# Patient Record
Sex: Female | Born: 1990 | Race: Black or African American | Hispanic: No | Marital: Single | Smoking: Never smoker
Health system: Southern US, Community
[De-identification: ages and names within clinical notes are randomized; demographics above are authoritative.]

## PROBLEM LIST (undated history)

## (undated) DIAGNOSIS — Z973 Presence of spectacles and contact lenses: Secondary | ICD-10-CM

## (undated) DIAGNOSIS — F419 Anxiety disorder, unspecified: Secondary | ICD-10-CM

## (undated) HISTORY — DX: Anxiety disorder, unspecified: F41.9

## (undated) HISTORY — DX: Presence of spectacles and contact lenses: Z97.3

---
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
## 2010-12-12 ENCOUNTER — Encounter: Payer: Self-pay | Admitting: Family Medicine

## 2010-12-22 ENCOUNTER — Ambulatory Visit: Admission: RE | Admit: 2010-12-22 | Discharge: 2010-12-22 | Payer: Self-pay | Source: Home / Self Care

## 2010-12-22 DIAGNOSIS — R609 Edema, unspecified: Secondary | ICD-10-CM | POA: Insufficient documentation

## 2010-12-28 NOTE — Miscellaneous (Signed)
  Clinical Lists Changes  Observations: Added new observation of SOCIAL HX: Dad: [place] Mom: Alan Ripper Sisters: Lennox Grumbles, Fayrene Helper Brothers: Widdi, Otteme  [employer] (12/12/2010 17:00) Added new observation of PSH REVIEWED: reviewed - no changes required (12/12/2010 17:00) Added new observation of PAST SURG HX: None (12/12/2010 17:00) Added new observation of PMH REVIEWED: reviewed - no changes required (12/12/2010 17:00) Added new observation of PAST MED HX: None (12/12/2010 17:00)      Past History:  Past Medical History: None  Past Surgical History: None   Social History: Dad: Nguessen Mom: [place] Sisters: Lennox Grumbles, Fayrene Helper Brothers: Widdi, Otteme  [employer]

## 2010-12-28 NOTE — Assessment & Plan Note (Signed)
Summary: np/family sees Milah Recht/eo   Vital Signs:  Patient profile:   20 year old female Height:      64 inches Weight:      1353.44 pounds BMI:     233.16 BSA:     4.41 Temp:     97.9 degrees F Pulse rate:   72 / minute BP sitting:   109 / 78  Vitals Entered By: Jone Baseman CMA (December 22, 2010 4:27 PM) CC: new patient Is Patient Diabetic? No Pain Assessment Patient in pain? no        CC:  new patient.  History of Present Illness: New patient visit  Questions or concerns 1. Leg swelling:  She usually gets leg swelling if she is on her feet for a couple of hours.    Health Maintanence: 1. Diet: Eats a fairly balanced diet.  Lots of carbs.  Lots of salt 2. Exercise: Doesn't exercise 3. Sexual Health: Isn't sexually active now 4. Wears seatbelts    Habits & Providers  Alcohol-Tobacco-Diet     Tobacco Status: never  Current Medications (verified): 1)  None  Past History:  Past Medical History: Reviewed history from 12/12/2010 and no changes required. None  Social History: Dad: Nguessen Mom: Alan Ripper Sisters: Emma, [place], [place] Brothers: Widdi, Otteme  [address] - Liberal Arts degree - interested in going to law school to do [employer] Status:  never  Review of Systems       The patient complains of peripheral edema.  The patient denies fever, weight loss, decreased hearing, hoarseness, syncope, prolonged cough, headaches, and abdominal pain.    Physical Exam  General:  Vitals reviewed.  Well appearing.  Comfortable.  Normal dress. Head:  normocephalic and atraumatic.   Eyes:  vision grossly intact.  PERRL, EOMI Ears:  R ear normal and L ear normal.   Nose:  no external deformity.   Mouth:  good dentition and pharynx pink and moist.   Neck:  supple, full ROM, and no masses.   Lungs:  normal respiratory effort, normal breath sounds, no crackles, and no wheezes.   Heart:  normal rate, regular rhythm, and no  murmur.   Abdomen:  soft, non-tender, normal bowel sounds, no distention, and no masses.   Msk:  normal ROM.   Extremities:  no LE edema Neurologic:  alert & oriented X3, strength normal in all extremities, sensation intact to light touch, and gait normal.   Skin:  color normal and no rashes.   Psych:  not anxious appearing and not depressed appearing.     Impression & Recommendations:  Problem # 1:  Preventive Health Care (ICD-V70.0) Assessment New Healthy 20 yo F.  Reviewed normal health maintanence issues for college age females.  Problem # 2:  LEG EDEMA, BILATERAL (ICD-782.3) Assessment: New None today on exam.  Sounds like dependent edema after standing for a long time.  Advised her to cut back on salt.  No other red flags.  Patient Instructions: 1)  You are doing well  2)  Please schedule a follow up appointment in 1 year, sooner if you have questions or concerns   Orders Added: 1)  [facility]- New Level 3 [99203]

## 2011-03-12 LAB — RAPID STREP SCREEN (MED CTR MEBANE ONLY): Streptococcus, Group A Screen (Direct): NEGATIVE

## 2011-07-02 ENCOUNTER — Emergency Department (HOSPITAL_COMMUNITY): Payer: Self-pay

## 2011-07-02 ENCOUNTER — Emergency Department (HOSPITAL_COMMUNITY)
Admission: EM | Admit: 2011-07-02 | Discharge: 2011-07-02 | Disposition: A | Discharge status: Home / Self Care | Payer: Self-pay | Attending: Emergency Medicine | Admitting: Emergency Medicine

## 2011-07-02 DIAGNOSIS — M79609 Pain in unspecified limb: Secondary | ICD-10-CM | POA: Insufficient documentation

## 2011-07-02 DIAGNOSIS — M25469 Effusion, unspecified knee: Secondary | ICD-10-CM | POA: Insufficient documentation

## 2011-07-02 DIAGNOSIS — M25569 Pain in unspecified knee: Secondary | ICD-10-CM | POA: Insufficient documentation

## 2011-07-02 DIAGNOSIS — M7989 Other specified soft tissue disorders: Secondary | ICD-10-CM | POA: Insufficient documentation

## 2012-05-28 ENCOUNTER — Encounter (HOSPITAL_COMMUNITY): Payer: Self-pay | Admitting: *Deleted

## 2012-05-28 ENCOUNTER — Emergency Department (HOSPITAL_COMMUNITY)
Admission: EM | Admit: 2012-05-28 | Discharge: 2012-05-28 | Disposition: A | Discharge status: Home / Self Care | Payer: No Typology Code available for payment source | Attending: Emergency Medicine | Admitting: Emergency Medicine

## 2012-05-28 DIAGNOSIS — S060X9A Concussion with loss of consciousness of unspecified duration, initial encounter: Secondary | ICD-10-CM | POA: Insufficient documentation

## 2012-05-28 MED ORDER — DIAZEPAM 5 MG PO TABS: 5.0000 mg | ORAL_TABLET | Freq: Two times a day (BID) | ORAL | Status: AC

## 2012-05-28 NOTE — ED Provider Notes (Signed)
Medical screening examination/treatment/procedure(s) were performed by non-physician practitioner and as supervising physician I was immediately available for consultation/collaboration.  Kasey Ewings T Culley Hedeen, MD 05/28/12 2040 
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
## 2013-03-02 ENCOUNTER — Encounter (HOSPITAL_COMMUNITY): Payer: Self-pay | Admitting: Emergency Medicine

## 2013-03-02 ENCOUNTER — Emergency Department (HOSPITAL_COMMUNITY): Payer: Self-pay

## 2013-03-02 ENCOUNTER — Emergency Department (HOSPITAL_COMMUNITY)
Admission: EM | Admit: 2013-03-02 | Discharge: 2013-03-02 | Disposition: A | Discharge status: Home / Self Care | Payer: Self-pay | Attending: Emergency Medicine | Admitting: Emergency Medicine

## 2013-03-02 DIAGNOSIS — R0602 Shortness of breath: Secondary | ICD-10-CM | POA: Insufficient documentation

## 2013-03-02 DIAGNOSIS — R0789 Other chest pain: Secondary | ICD-10-CM | POA: Insufficient documentation

## 2013-03-02 MED ORDER — PREDNISONE 20 MG PO TABS: 40.0000 mg | ORAL_TABLET | Freq: Every day | ORAL | Status: DC

## 2013-03-02 MED ORDER — ALBUTEROL SULFATE HFA 108 (90 BASE) MCG/ACT IN AERS: 1.0000 | INHALATION_SPRAY | Freq: Four times a day (QID) | RESPIRATORY_TRACT | Status: DC | PRN
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
## 2013-03-04 NOTE — ED Provider Notes (Signed)
Medical screening examination/treatment/procedure(s) were performed by non-physician practitioner and as supervising physician I was immediately available for consultation/collaboration.  Glynn Octave, MD 03/04/13 1001

## 2014-04-10 ENCOUNTER — Emergency Department (HOSPITAL_COMMUNITY): Payer: No Typology Code available for payment source

## 2014-04-10 ENCOUNTER — Emergency Department (HOSPITAL_COMMUNITY)
Admission: EM | Admit: 2014-04-10 | Discharge: 2014-04-11 | Disposition: A | Discharge status: Home / Self Care | Payer: No Typology Code available for payment source | Attending: Emergency Medicine | Admitting: Emergency Medicine

## 2014-04-10 DIAGNOSIS — J189 Pneumonia, unspecified organism: Secondary | ICD-10-CM

## 2014-04-10 DIAGNOSIS — R000 Tachycardia, unspecified: Secondary | ICD-10-CM | POA: Insufficient documentation

## 2014-04-10 DIAGNOSIS — J159 Unspecified bacterial pneumonia: Secondary | ICD-10-CM | POA: Insufficient documentation

## 2014-04-10 DIAGNOSIS — J029 Acute pharyngitis, unspecified: Secondary | ICD-10-CM | POA: Insufficient documentation

## 2014-04-10 DIAGNOSIS — H53149 Visual discomfort, unspecified: Secondary | ICD-10-CM | POA: Insufficient documentation

## 2014-04-10 LAB — RAPID STREP SCREEN (MED CTR MEBANE ONLY): Streptococcus, Group A Screen (Direct): NEGATIVE

## 2014-04-10 MED ORDER — DIPHENHYDRAMINE HCL 50 MG/ML IJ SOLN
25.0000 mg | Freq: Once | INTRAMUSCULAR | Status: AC
  Administered 2014-04-10: 25 mg via INTRAVENOUS
  Filled 2014-04-10: qty 1

## 2014-04-10 MED ORDER — ACETAMINOPHEN 500 MG PO TABS
1000.0000 mg | ORAL_TABLET | Freq: Once | ORAL | Status: AC
  Administered 2014-04-10: 1000 mg via ORAL
  Filled 2014-04-10: qty 2

## 2014-04-10 MED ORDER — KETOROLAC TROMETHAMINE 30 MG/ML IJ SOLN
30.0000 mg | Freq: Once | INTRAMUSCULAR | Status: AC
  Administered 2014-04-10: 30 mg via INTRAVENOUS
  Filled 2014-04-10: qty 1

## 2014-04-10 MED ORDER — METOCLOPRAMIDE HCL 5 MG/ML IJ SOLN
10.0000 mg | Freq: Once | INTRAMUSCULAR | Status: AC
  Administered 2014-04-10: 10 mg via INTRAVENOUS
  Filled 2014-04-10: qty 2

## 2014-04-10 MED ORDER — AZITHROMYCIN 250 MG PO TABS: 250.0000 mg | ORAL_TABLET | Freq: Every day | ORAL | Status: AC

## 2014-04-10 MED ORDER — SODIUM CHLORIDE 0.9 % IV BOLUS (SEPSIS)
1000.0000 mL | Freq: Once | INTRAVENOUS | Status: AC
  Administered 2014-04-10: 1000 mL via INTRAVENOUS

## 2014-04-10 MED ORDER — AZITHROMYCIN 250 MG PO TABS
500.0000 mg | ORAL_TABLET | Freq: Once | ORAL | Status: AC
  Administered 2014-04-10: 500 mg via ORAL
  Filled 2014-04-10: qty 2

## 2014-04-10 NOTE — ED Provider Notes (Signed)
CSN: [id number]     Arrival date & time 04/10/14  1939 History   First MD Initiated Contact with Patient 04/10/14 2027     No chief complaint on file.    (Consider location/radiation/quality/duration/timing/severity/associated sxs/prior Treatment) HPI Comments: Patient presents to the emergency department with chief complaint of headache, and sore throat that started on Friday. She states that she is progressively been feeling worse. She has not taken anything to alleviate her symptoms. She denies any neck pain, or neck stiffness. She states that she lives at home with her parents. She endorses photophobia and phonophobia. She also reports sore throat, that is worsened with swallowing. There are no alleviating factors. She denies any difficulty breathing.  She denies sick contacts.  The history is provided by the patient. No language interpreter was used.    No past medical history on file. No past surgical history on file. No family history on file. History  Substance Use Topics  . Smoking status: Never Smoker   . Smokeless tobacco: Not on file  . Alcohol Use:    OB History   Grav Para Term Preterm Abortions TAB SAB Ect Mult Living                 Review of Systems  Constitutional: Positive for fever and chills.  Respiratory: Positive for cough. Negative for shortness of breath.   Cardiovascular: Negative for chest pain.  Gastrointestinal: Negative for nausea, vomiting, diarrhea and constipation.  Genitourinary: Negative for dysuria.  Neurological: Positive for headaches.      Allergies  Review of patient's allergies indicates no known allergies.  Home Medications   Prior to Admission medications   Not on File   BP 122/73  Pulse 121  Temp(Src) 102.8 F (39.3 C) (Oral)  Resp 22  SpO2 99% Physical Exam  Nursing note and vitals reviewed. Constitutional: She is oriented to person, place, and time. She appears well-developed and well-nourished.  HENT:  Head:  Normocephalic and atraumatic.  Right Ear: External ear normal.  Left Ear: External ear normal.  Eyes: Conjunctivae and EOM are normal. Pupils are equal, round, and reactive to light.  Neck: Normal range of motion. Neck supple.  No pain with neck flexion, no meningismus  Cardiovascular: Regular rhythm and normal heart sounds.  Exam reveals no gallop and no friction rub.   No murmur heard. Tachycardic  Pulmonary/Chest: Effort normal and breath sounds normal. No respiratory distress. She has no wheezes. She has no rales. She exhibits no tenderness.  Abdominal: Soft. She exhibits no distension and no mass. There is no tenderness. There is no rebound and no guarding.  Musculoskeletal: Normal range of motion. She exhibits no edema and no tenderness.  Normal gait.  Neurological: She is alert and oriented to person, place, and time.  CN 3-12 intact, sensation and strength intact bilaterally.  Skin: Skin is warm and dry.  Psychiatric: She has a normal mood and affect. Her behavior is normal. Judgment and thought content normal.    ED Course  Procedures (including critical care time) Results for orders placed during the hospital encounter of 04/10/14  RAPID STREP SCREEN      Result Value Ref Range   Streptococcus, Group A Screen (Direct) NEGATIVE  NEGATIVE   Dg Chest 2 View  04/10/2014   CLINICAL DATA:  Pharyngitis.  Headache.  Bilateral arm tingling.  EXAM: CHEST  2 VIEW  COMPARISON:  03/02/2013.  FINDINGS: Normal sized heart. Interval airspace opacity in the right middle lobe and  mild to moderate central peribronchial thickening. Clear left lung. Minimal scoliosis.  IMPRESSION: 1. Right middle lobe pneumonia. 2. Mild to moderate bronchitic changes.   Electronically Signed   By: Gordan PaymentSteve  Reid M.D.   On: 04/10/2014 22:51     Imaging Review No results found.   EKG Interpretation None      MDM   Final diagnoses:  Community acquired pneumonia    Patient with headache, fever, sore throat  that started on Friday. Will give migraine cocktail, and fluids. Will check rapid strep. No meningismus.  She looks well and is not in obvious distress.  12:00 AM Patient seen by and discussed with Dr. Jeraldine LootsLockwood.  Will treat pneumonia with azithromycin.  First dose in the ED.  DC to home.  Return precautions given.  Patient understands and agrees with the plan.  Ambulates maintaining >95% O2 saturation.  VSS.  Now afebrile.  Roxy Horsemanobert Kelisha Dall, PA-C 04/11/14 0001

## 2014-04-10 NOTE — ED Notes (Signed)
PA at bedside.

## 2014-04-10 NOTE — Discharge Instructions (Signed)

## 2014-04-10 NOTE — ED Notes (Addendum)
Patient is here with c/o dry mouth, sore throat, and headache that strated Friday. Patient report not having appetite and nausea since. Patient states when she bends over she feels like she is going to fall. Pain in bilateral ear more so in the left ear. Patient states she also has tingling in bilateral arms when she reach. Patient states her eyes are sensitive to light and ear sensitive to volume. Patient denies taking any medications at home.

## 2014-04-11 NOTE — ED Provider Notes (Signed)
  This was a shared visit with a mid-level provided (NP or PA).  Throughout the patient's course I was available for consultation/collaboration.  I saw the ECG (if appropriate), relevant labs and studies - I agree with the interpretation.  On my exam the patient was in no distress, stating that she was feeling better. Patient was found to have CAP, and discharged in stable condition.       Gerhard Munchobert Tyriana Helmkamp, MD 04/11/14 [phone]

## 2014-04-12 LAB — CULTURE, GROUP A STREP

## 2014-08-15 ENCOUNTER — Emergency Department (HOSPITAL_COMMUNITY)
Admission: EM | Admit: 2014-08-15 | Discharge: 2014-08-15 | Disposition: A | Discharge status: Home / Self Care | Payer: No Typology Code available for payment source | Attending: Emergency Medicine | Admitting: Emergency Medicine

## 2014-08-15 ENCOUNTER — Encounter (HOSPITAL_COMMUNITY): Payer: Self-pay | Admitting: Emergency Medicine

## 2014-08-15 ENCOUNTER — Emergency Department (HOSPITAL_COMMUNITY)
Admission: EM | Admit: 2014-08-15 | Discharge: 2014-08-15 | Discharge status: Against Medical Advice | Payer: No Typology Code available for payment source | Attending: Emergency Medicine | Admitting: Emergency Medicine

## 2014-08-15 DIAGNOSIS — Z3202 Encounter for pregnancy test, result negative: Secondary | ICD-10-CM | POA: Diagnosis not present

## 2014-08-15 DIAGNOSIS — R000 Tachycardia, unspecified: Secondary | ICD-10-CM | POA: Diagnosis not present

## 2014-08-15 DIAGNOSIS — R42 Dizziness and giddiness: Secondary | ICD-10-CM | POA: Insufficient documentation

## 2014-08-15 DIAGNOSIS — F411 Generalized anxiety disorder: Secondary | ICD-10-CM | POA: Insufficient documentation

## 2014-08-15 DIAGNOSIS — R030 Elevated blood-pressure reading, without diagnosis of hypertension: Secondary | ICD-10-CM | POA: Insufficient documentation

## 2014-08-15 DIAGNOSIS — R6883 Chills (without fever): Secondary | ICD-10-CM | POA: Diagnosis not present

## 2014-08-15 LAB — CBC
HEMATOCRIT: 41.9 % (ref 36.0–46.0)
Hemoglobin: 13.5 g/dL (ref 12.0–15.0)
MCH: 26.7 pg (ref 26.0–34.0)
MCHC: 32.2 g/dL (ref 30.0–36.0)
MCV: 82.8 fL (ref 78.0–100.0)
Platelets: 218 10*3/uL (ref 150–400)
RBC: 5.06 MIL/uL (ref 3.87–5.11)
RDW: 14.5 % (ref 11.5–15.5)
WBC: 8.3 10*3/uL (ref 4.0–10.5)

## 2014-08-15 LAB — COMPREHENSIVE METABOLIC PANEL
ALBUMIN: 4.8 g/dL (ref 3.5–5.2)
ALT: 23 U/L (ref 0–35)
AST: 26 U/L (ref 0–37)
Alkaline Phosphatase: 90 U/L (ref 39–117)
Anion gap: 16 — ABNORMAL HIGH (ref 5–15)
BUN: 8 mg/dL (ref 6–23)
CALCIUM: 10 mg/dL (ref 8.4–10.5)
CO2: 24 mEq/L (ref 19–32)
CREATININE: 0.76 mg/dL (ref 0.50–1.10)
Chloride: 100 mEq/L (ref 96–112)
GFR calc Af Amer: >90 mL/min (ref >90)
GFR calc non Af Amer: >90 mL/min (ref >90)
GLUCOSE: 107 mg/dL — AB (ref 70–99)
POTASSIUM: 4.2 meq/L (ref 3.7–5.3)
Sodium: 140 mEq/L (ref 137–147)
TOTAL PROTEIN: 9.2 g/dL — AB (ref 6.0–8.3)
Total Bilirubin: 0.3 mg/dL (ref 0.3–1.2)

## 2014-08-15 LAB — POC URINE PREG, ED: Preg Test, Ur: NEGATIVE

## 2014-08-15 LAB — CBG MONITORING, ED: Glucose-Capillary: 128 mg/dL — ABNORMAL HIGH (ref 70–99)

## 2014-08-15 LAB — URINALYSIS, ROUTINE W REFLEX MICROSCOPIC
Bilirubin Urine: NEGATIVE
Glucose, UA: NEGATIVE mg/dL
Hgb urine dipstick: NEGATIVE
Ketones, ur: NEGATIVE mg/dL
NITRITE: NEGATIVE
PH: 7.5 (ref 5.0–8.0)
Protein, ur: NEGATIVE mg/dL
SPECIFIC GRAVITY, URINE: 1.003 — AB (ref 1.005–1.030)
Urobilinogen, UA: 0.2 mg/dL (ref 0.0–1.0)

## 2014-08-15 LAB — URINE MICROSCOPIC-ADD ON

## 2014-08-15 NOTE — ED Notes (Addendum)
Pt reports that she is leaving with ALL belongings she arrived with. She is ambulatory with no issues adnwith steady gait.  A bus pass was offered due to patient's ride not arriving until 1pm however, pt declines and states  " no, I live [place] out". This RN showed her to the lobby and made her aware of where the phone is.

## 2014-08-15 NOTE — ED Notes (Signed)
Pt tolerating fluids well. 

## 2014-08-15 NOTE — ED Notes (Signed)
Phlebotomy at bedside.

## 2014-08-15 NOTE — ED Notes (Signed)
Pt. reports chills , " feeling funny " , elevated blood pressure after drinking P[employer] . Alert and oriented , respirations unlabored / ambulatory . Denies pain .

## 2014-08-15 NOTE — ED Notes (Signed)
Lab is at the bedside attempting to draw blood, will obtain orthostatic vitals signs when they are finished.

## 2014-08-15 NOTE — ED Notes (Addendum)
Main Lab phlebotomy called for lab draws.

## 2014-08-15 NOTE — ED Notes (Signed)
Pt arrived to the Ed with a complaint of anxiety.  Pt states's he has felt shaky and out of balance since 0130 hours.  Pt states she feels she is hyperglycemic  Due to a history in her family

## 2014-08-15 NOTE — ED Notes (Signed)
PA at bedside.

## 2014-08-15 NOTE — ED Notes (Signed)
Prior tech and RN made multiple attempts to obtain blood samples. Not successful.

## 2014-08-15 NOTE — ED Provider Notes (Signed)
CSN: [id number]     Arrival date & time 08/15/14  0355 History   First MD Initiated Contact with Patient 08/15/14 [phone]     Chief Complaint  Patient presents with  . Anxiety     (Consider location/radiation/quality/duration/timing/severity/associated sxs/prior Treatment) HPI Comments: Patient was at a birthday party last night. She admits to having a small amount of alcohol in one pina colada. She otherwise denies other substances. At approximately 1:30 AM patient began to have chills and shaking. She states that she was confused and off balance. This lasted for several hours until about 6:30 AM while she was waiting in emergency department. Patient had pneumonia several months ago but denies persistant cough. She denies URI symptoms, chest pain, lightheadedness, syncope. She denies shortness of breath. No urinary symptoms, diarrhea, bleeding. She is now back to her baseline. No similar episodes like this. No history of anxiety disorder. Patient denies signs of stroke including: facial droop, slurred speech, aphasia, weakness/numbness in extremities, imbalance/trouble walking. Patient denies risk factors for pulmonary embolism including: unilateral leg swelling, history of DVT/PE/other blood clots, use of estrogens, recent immobilizations, recent surgery, recent travel (>4hr segment), malignancy, hemoptysis.     Patient is a 23 y.o. female presenting with anxiety. The history is provided by the patient and a relative.  Anxiety Associated symptoms include chills. Pertinent negatives include no abdominal pain, chest pain, coughing, fever, headaches, myalgias, nausea, numbness, rash, sore throat, vomiting or weakness.    History reviewed. No pertinent past medical history. History reviewed. No pertinent past surgical history. History reviewed. No pertinent family history. History  Substance Use Topics  . Smoking status: Never Smoker   . Smokeless tobacco: Not on file  . Alcohol Use: Yes   OB  History   Grav Para Term Preterm Abortions TAB SAB Ect Mult Living                 Review of Systems  Constitutional: Positive for chills. Negative for fever.  HENT: Negative for rhinorrhea and sore throat.   Eyes: Negative for redness.  Respiratory: Negative for cough.   Cardiovascular: Negative for chest pain.  Gastrointestinal: Negative for nausea, vomiting, abdominal pain and diarrhea.  Genitourinary: Negative for dysuria.  Musculoskeletal: Negative for myalgias.  Skin: Negative for rash.  Neurological: Positive for dizziness and tremors. Negative for syncope, facial asymmetry, weakness, light-headedness, numbness and headaches.  Psychiatric/Behavioral: Positive for behavioral problems.    Allergies  Review of patient's allergies indicates no known allergies.  Home Medications   Prior to Admission medications   Not on File   BP 141/79  Pulse 106  Temp(Src) 98.4 F (36.9 C) (Oral)  Resp 20  SpO2 100%  LMP 05/15/2014  Physical Exam  Nursing note and vitals reviewed. Constitutional: She appears well-developed and well-nourished.  HENT:  Head: Normocephalic and atraumatic.  Right Ear: Tympanic membrane, external ear and ear canal normal.  Left Ear: Tympanic membrane, external ear and ear canal normal.  Nose: Nose normal. No mucosal edema or rhinorrhea.  Mouth/Throat: Uvula is midline, oropharynx is clear and moist and mucous membranes are normal. Mucous membranes are not dry. No oral lesions. No trismus in the jaw. No uvula swelling. No oropharyngeal exudate, posterior oropharyngeal edema, posterior oropharyngeal erythema or tonsillar abscesses.  Eyes: Conjunctivae are normal. Right eye exhibits no discharge. Left eye exhibits no discharge.  Neck: Normal range of motion. Neck supple.  Cardiovascular: Regular rhythm and normal heart sounds.  Tachycardia present.   No murmur heard. Mild tachycardia.  Pulmonary/Chest: Effort normal and breath sounds normal. No  respiratory distress. She has no wheezes. She has no rales.  Abdominal: Soft. There is no tenderness.  Musculoskeletal: She exhibits no edema and no tenderness.  No LE edema or tenderness.   Lymphadenopathy:    She has no cervical adenopathy.  Neurological: She is alert.  Skin: Skin is warm and dry.  Psychiatric: She has a normal mood and affect.    ED Course  Procedures (including critical care time) Labs Review Labs Reviewed  COMPREHENSIVE METABOLIC PANEL - Abnormal; Notable for the following:    Glucose, Bld 107 (*)    Total Protein 9.2 (*)    Anion gap 16 (*)    All other components within normal limits  URINALYSIS, ROUTINE W REFLEX MICROSCOPIC - Abnormal; Notable for the following:    Specific Gravity, Urine 1.003 (*)    Leukocytes, UA SMALL (*)    All other components within normal limits  URINE MICROSCOPIC-ADD ON - Abnormal; Notable for the following:    Squamous Epithelial / LPF FEW (*)    All other components within normal limits  CBG MONITORING, ED - Abnormal; Notable for the following:    Glucose-Capillary 128 (*)    All other components within normal limits  CBC  CBG MONITORING, ED  POC URINE PREG, ED    Imaging Review No results found.   EKG Interpretation None      8:42 AM Patient seen and examined. Work-up initiated. Will check orthostatics. If significantly orthostatic, will give IV fluids. Otherwise PO fluids. Patient appears well.   Vital signs reviewed and are as follows: BP 141/79  Pulse 106  Temp(Src) 98.4 F (36.9 C) (Oral)  Resp 20  SpO2 100%  LMP 05/15/2014  10:44 AM spoke with patient on a bedside and I informed her of her results. She is drinking well in the room. She is not orthostatic and I have not given IV fluids. I encouraged her to go home and rest and hydrate well today. She seems a little nervous not knowing what caused her symptoms. She doubts that anybody slipped an unknown substance into anything that she was drinking, however  she is not completely sure. Regardless, patient has returned to baseline and is doing very well. Feel that she is safe for discharge home.  I have encouraged patient to return with any concerns including return of symptoms, confusion, vomiting, headaches or development of pain in other areas. Patient verbalizes understanding and agrees with plan.    MDM   Final diagnoses:  Dizziness   Patients with symptoms most consistent with intoxication however patient denies a large ingestion of alcohol. She does not drink a lot. Her symptoms resolved with time and she is now to baseline and is doing well. She is tolerating fluids. Her lab workup was largely unremarkable. She will return home with supportive management and return to the emergency department if any concerns.    Renne Crigler, PA-C 08/15/14 1047

## 2014-08-15 NOTE — Discharge Instructions (Signed)
Please read and follow all provided instructions.  Your diagnoses today include:  1. Dizziness    Tests performed today include:  Vital signs. See below for your results today.   Blood counts and electrolytes - normal  Liver and kidney function - normal  Urine test and test for urine pregnancy - negative  Blood sugar - normal  Medications prescribed:   None  Take any prescribed medications only as directed.  Home care instructions:  Follow any educational materials contained in this packet.  Rest today and hydrate well.   BE VERY CAREFUL not to take multiple medicines containing Tylenol (also called acetaminophen). Doing so can lead to an overdose which can damage your liver and cause liver failure and possibly death.   Follow-up instructions: Please follow-up with your primary care provider in the next 3 days for further evaluation of your symptoms.   Return instructions:   Please return to the Emergency Department if you experience worsening symptoms.   Return if you pass out, develop pain, become more confused.   Please return if you have any other emergent concerns.  Additional Information:  Your vital signs today were: BP 141/79   Pulse 106   Temp(Src) 98.4 F (36.9 C) (Oral)   Resp 20   SpO2 100%   LMP 05/15/2014 If your blood pressure (BP) was elevated above 135/85 this visit, please have this repeated by your doctor within one month. --------------

## 2014-08-15 NOTE — ED Notes (Signed)
Provided patient with pitcher of water to drink. She denies pain and ambulated to restroom with steady gait. She is calm and cooperative at this time.

## 2014-08-15 NOTE — ED Notes (Signed)
Patient aware that a urine sample is needed, pt will provide a sample when able to.

## 2014-08-16 NOTE — ED Provider Notes (Signed)
Medical screening examination/treatment/procedure(s) were performed by non-physician practitioner and as supervising physician I was immediately available for consultation/collaboration.   EKG Interpretation None        Audree Camel, MD 08/16/14 [phone]

## 2015-02-28 ENCOUNTER — Encounter (HOSPITAL_COMMUNITY): Payer: Self-pay | Admitting: Emergency Medicine

## 2015-02-28 ENCOUNTER — Emergency Department (HOSPITAL_COMMUNITY)
Admission: EM | Admit: 2015-02-28 | Discharge: 2015-02-28 | Disposition: A | Discharge status: Home / Self Care | Payer: No Typology Code available for payment source | Attending: Emergency Medicine | Admitting: Emergency Medicine

## 2015-02-28 ENCOUNTER — Emergency Department (HOSPITAL_COMMUNITY): Payer: No Typology Code available for payment source

## 2015-02-28 DIAGNOSIS — S8991XA Unspecified injury of right lower leg, initial encounter: Secondary | ICD-10-CM | POA: Insufficient documentation

## 2015-02-28 DIAGNOSIS — Y9341 Activity, dancing: Secondary | ICD-10-CM | POA: Insufficient documentation

## 2015-02-28 DIAGNOSIS — Y9229 Other specified public building as the place of occurrence of the external cause: Secondary | ICD-10-CM | POA: Insufficient documentation

## 2015-02-28 DIAGNOSIS — X58XXXA Exposure to other specified factors, initial encounter: Secondary | ICD-10-CM | POA: Insufficient documentation

## 2015-02-28 DIAGNOSIS — M25561 Pain in right knee: Secondary | ICD-10-CM

## 2015-02-28 DIAGNOSIS — Y998 Other external cause status: Secondary | ICD-10-CM | POA: Insufficient documentation

## 2015-02-28 DIAGNOSIS — Z3202 Encounter for pregnancy test, result negative: Secondary | ICD-10-CM | POA: Insufficient documentation

## 2015-02-28 LAB — POC URINE PREG, ED: Preg Test, Ur: NEGATIVE

## 2015-02-28 MED ORDER — NAPROXEN 500 MG PO TABS
500.0000 mg | ORAL_TABLET | Freq: Once | ORAL | Status: AC
  Administered 2015-02-28: 500 mg via ORAL
  Filled 2015-02-28: qty 1

## 2015-02-28 MED ORDER — HYDROCODONE-ACETAMINOPHEN 5-325 MG PO TABS: 1.0000 | ORAL_TABLET | ORAL | Status: DC | PRN

## 2015-02-28 MED ORDER — NAPROXEN 500 MG PO TABS: 500.0000 mg | ORAL_TABLET | Freq: Two times a day (BID) | ORAL | Status: DC

## 2015-02-28 NOTE — ED Notes (Signed)
Ortho tech at bedside to apply knee sleeve

## 2015-02-28 NOTE — ED Notes (Signed)
Ambulatory with steady gait to void in BR 

## 2015-02-28 NOTE — Discharge Instructions (Signed)
Please follow the directions provided. Be sure to follow-up with the orthopedic doctor if your pain does not improve. Please take the naproxen twice a day to help with pain and inflammation. You may use the Vicodin for pain not relieved by the naproxen. Please wear your knee sleeve for comfort and support. You may use heat and ice to also help with discomfort. Don't hesitate to return for any new, worsening, or concerning symptoms.   SEEK MEDICAL CARE IF:  You have knee pain that is continual and does not seem to be getting better.  SEEK IMMEDIATE MEDICAL CARE IF:  Your knee joint feels hot to the touch and you have a high fever.

## 2015-02-28 NOTE — ED Provider Notes (Signed)
CSN: [id number]     Arrival date & time 02/28/15  1217 History  This chart was scribed for non-physician practitioner, Harle BattiestElizabeth Serrena Linderman, working with Arby BarretteMarcy Pfeiffer, MD by Richarda Overlieichard Holland, ED Scribe. This patient was seen in room WTR5/WTR5 and the patient's care was started at 12:34 PM.  No chief complaint on file.  The history is provided by the patient. No language interpreter was used.   HPI Comments: Kathleen Casey is a 24 y.o. female who presents to the Emergency Department complaining of worsening right knee pain that started last night at 9PM. She states that she was doing a dance exercise when she twisted her right knee while her right foot was planted. She states she did not fall directly on her knee. Pt states that her pain worsens when walking and rates it as a 7/10 at this time. Pt reports no prior issues with her right knee. She states that she is unsure when her LNMP was and reports that she does not take birth control. She reports no alleviating factors at this time.   History reviewed. No pertinent past medical history. History reviewed. No pertinent past surgical history. History reviewed. No pertinent family history. History  Substance Use Topics  . Smoking status: Never Smoker   . Smokeless tobacco: Not on file  . Alcohol Use: Yes   OB History    No data available     Review of Systems  Musculoskeletal: Positive for arthralgias.  All other systems reviewed and are negative.     Allergies  Review of patient's allergies indicates no known allergies.  Home Medications   Prior to Admission medications   Not on File   BP 128/74 mmHg  Pulse 91  Temp(Src) 98 F (36.7 C) (Oral)  Resp 16  SpO2 100% Physical Exam  Constitutional: She is oriented to person, place, and time. She appears well-developed and well-nourished.  HENT:  Head: Normocephalic and atraumatic.  Eyes: Right eye exhibits no discharge. Left eye exhibits no discharge.  Neck: No tracheal deviation  present.  Cardiovascular: Normal rate.   Pulmonary/Chest: Effort normal. No respiratory distress.  Abdominal: She exhibits no distension.  Musculoskeletal:  5/5 plantar and dorsiflexion. No TTP but pain upon medial infrapatellar area of kneecap with walking. No patellar laxity noted.   Neurological: She is alert and oriented to person, place, and time.  Skin: Skin is warm and dry.  Psychiatric: She has a normal mood and affect. Her behavior is normal.  Nursing note and vitals reviewed.   ED Course  Procedures   DIAGNOSTIC STUDIES: Oxygen Saturation is 100% on RA, normal by my interpretation.    COORDINATION OF CARE: 12:39 PM Discussed treatment plan with pt at bedside and pt agreed to plan.   Labs Review Labs Reviewed  POC URINE PREG, ED    Imaging Review Dg Knee Complete 4 Views Right  02/28/2015   CLINICAL DATA:  Twisting injury 02/27/2015, initial encounter.  EXAM: RIGHT KNEE - COMPLETE 4+ VIEW  COMPARISON:  07/02/2011.  FINDINGS: No acute osseous or joint abnormality.  IMPRESSION: No acute osseous or joint abnormality.   Electronically Signed   By: Leanna BattlesMelinda  Blietz M.D.   On: 02/28/2015 13:25     EKG Interpretation None      MDM   Final diagnoses:  Right knee pain   24 yo with knee pain after twisting while exercising.  She has no swelling, weakness, effusion, redness, or patellar laxity. Her x-ray is negative for obvious fracture or dislocation.  Her pain managed in ED. Pt advised to follow up with orthopedics if symptoms persist. Patient given brace while in ED, conservative therapy recommended and discussed. Patient will be dc home & is agreeable with above plan.   I personally performed the services described in this documentation, which was scribed in my presence. The recorded information has been reviewed and is accurate.  Filed Vitals:   02/28/15 1220 02/28/15 1348  BP: 128/74 129/77  Pulse: 91 79  Temp: 98 F (36.7 C)   TempSrc: Oral   Resp: 16 18  SpO2:  100% 100%   Meds given in ED:  Medications  naproxen (NAPROSYN) tablet 500 mg (500 mg Oral Given 02/28/15 1252)    Discharge Medication List as of 02/28/2015  1:29 PM    START taking these medications   Details  HYDROcodone-acetaminophen (NORCO/VICODIN) 5-325 MG per tablet Take 1 tablet by mouth every 4 (four) hours as needed., Starting 02/28/2015, Until Discontinued, Print    naproxen (NAPROSYN) 500 MG tablet Take 1 tablet (500 mg total) by mouth 2 (two) times daily., Starting 02/28/2015, Until Discontinued, Print           Harle Battiest, NP 03/02/15 1610  Arby Barrette, MD 03/04/15 2110

## 2015-02-28 NOTE — ED Notes (Signed)
Pt states she was dancing yesterday, felt her knee pull incorrectly. Pt states knee had difficulty supporting her weight yesterday, c/o increased pain today.

## 2015-06-02 ENCOUNTER — Emergency Department (HOSPITAL_COMMUNITY)
Admission: EM | Admit: 2015-06-02 | Discharge: 2015-06-02 | Disposition: A | Discharge status: Home / Self Care | Payer: 59 | Attending: Emergency Medicine | Admitting: Emergency Medicine

## 2015-06-02 ENCOUNTER — Encounter (HOSPITAL_COMMUNITY): Payer: Self-pay

## 2015-06-02 DIAGNOSIS — R21 Rash and other nonspecific skin eruption: Secondary | ICD-10-CM | POA: Diagnosis present

## 2015-06-02 DIAGNOSIS — B850 Pediculosis due to Pediculus humanus capitis: Secondary | ICD-10-CM | POA: Diagnosis not present

## 2015-06-02 MED ORDER — PERMETHRIN 1 % EX LIQD: Freq: Once | CUTANEOUS | Status: DC

## 2015-06-02 NOTE — Discharge Instructions (Signed)
Use shampoo once today and again in 7-10 days.  Head and Pubic Lice Lice are tiny, light brown insects with claws on the ends of their legs. They are small parasites that live on the human body. Lice often make their home in your hair. They hatch from little round eggs (nits), which are attached to the base of hairs. They spread by:  Direct contact with an infested person.  Infested personal items such as combs, brushes, towels, clothing, pillow cases and sheets. The parasite that causes your condition may also live in clothes which have been worn within the week before treatment. Therefore, it is necessary to wash your clothes, bed linens, towels, combs and brushes. Any woolens can be put in an air-tight plastic bag for one week. You need to use fresh clothes, towels and sheets after your treatment is completed. Re-treatment is usually not necessary if instructions are followed. If necessary, treatment may be repeated in 7 days. The entire family may require treatment. Sexual partners should be treated if the nits are present in the pubic area. TREATMENT  Apply enough medicated shampoo or cream to wet hair and skin in and around the infected areas.  Work thoroughly into hair and leave in according to instructions.  Add a small amount of water until a good lather forms.  Rinse thoroughly.  Towel briskly.  When hair is dry, any remaining nits, cream or shampoo may be removed with a fine-tooth comb or tweezers. The nits resemble dandruff; however they are glued to the hair follicle and are difficult to brush out. Frequent fine combing and shampoos are necessary. A towel soaked in white vinegar and left on the hair for 2 hours will also help soften the glue which holds the nits on the hair. Medicated shampoo or cream should not be used on children or pregnant women without a caregiver's prescription or instructions. SEEK MEDICAL CARE IF:   You or your child develops sores that look  infected.  The rash does not go away in one week.  The lice or nits return or persist in spite of treatment. Document Released: 11/12/2005 Document Revised: 02/04/2012 Document Reviewed: 06/11/2007 [facility] Patient Information 2015 [employer] [place], [place]. This information is not intended to replace advice given to you by your health care provider. Make sure you discuss any questions you have with your health care provider.

## 2015-06-02 NOTE — ED Provider Notes (Signed)
CSN: [id number]     Arrival date & time 06/02/15  [id number] History   First MD Initiated Contact with Patient 06/02/15 [phone]     Chief Complaint  Patient presents with  . Rash     (Consider location/radiation/quality/duration/timing/severity/associated sxs/prior Treatment) HPI Comments: 24 year old female complaining of a rash to the back of her head and neck 1 week, worsening. States the back of her head is very itchy, and this morning, when she scratched it, she noticed a small, black bug. No aggravating or alleviating factors. Another friend has itching in her head as well. Has fake braided hair in. Works as a Producer, television/film/videohair dresser.  Patient is a 24 y.o. female presenting with rash. The history is provided by the patient.  Rash Associated symptoms: no fever     History reviewed. No pertinent past medical history. History reviewed. No pertinent past surgical history. History reviewed. No pertinent family history. History  Substance Use Topics  . Smoking status: Never Smoker   . Smokeless tobacco: Not on file  . Alcohol Use: Yes   OB History    No data available     Review of Systems  Constitutional: Negative for fever.  Skin: Positive for rash.      Allergies  Review of patient's allergies indicates no known allergies.  Home Medications   Prior to Admission medications   Medication Sig Start Date End Date Taking? Authorizing Provider  ibuprofen (ADVIL,MOTRIN) 200 MG tablet Take 400 mg by mouth every 6 (six) hours as needed for moderate pain.   Yes Historical Provider, MD  permethrin (NIX CREME RINSE) 1 % liquid Apply topically once. And again in 7-10 days. 06/02/15   Kaylor Maiers M Krisi Azua, PA-C   BP 103/76 mmHg  Pulse 95  Temp(Src) 98.2 F (36.8 C) (Oral)  Resp 20  SpO2 100% Physical Exam  Constitutional: She is oriented to person, place, and time. She appears well-developed and well-nourished. No distress.  HENT:  Head: Normocephalic and atraumatic.  Mouth/Throat: Oropharynx is  clear and moist.  Scattered tiny white insects present in back of scalp. No rash noted.  Eyes: Conjunctivae and EOM are normal.  Neck: Normal range of motion. Neck supple.  Cardiovascular: Normal rate, regular rhythm and normal heart sounds.   Pulmonary/Chest: Effort normal and breath sounds normal. No respiratory distress.  Musculoskeletal: Normal range of motion. She exhibits no edema.  Neurological: She is alert and oriented to person, place, and time. No sensory deficit.  Skin: Skin is warm and dry.  Psychiatric: She has a normal mood and affect. Her behavior is normal.  Nursing note and vitals reviewed.   ED Course  Procedures (including critical care time) Labs Review Labs Reviewed - No data to display  Imaging Review No results found.   EKG Interpretation None      MDM   Final diagnoses:  Head lice   Rx Nix cream rinse. Infection care/precautions discussed. Stable for d/c. Return precautions given. Patient states understanding of treatment care plan and is agreeable.  Kathrynn SpeedRobyn M Jacqueline Delapena, PA-C 06/02/15 [phone]  Loren Raceravid Yelverton, MD 06/04/15 [phone]

## 2015-06-02 NOTE — ED Notes (Signed)
PA at bedside.

## 2015-06-02 NOTE — ED Notes (Signed)
Pt complains of itching on the back of her neck and in her scalp for one week

## 2015-06-16 ENCOUNTER — Telehealth: Payer: Self-pay | Admitting: Medical

## 2015-06-16 ENCOUNTER — Encounter: Payer: Self-pay | Admitting: Medical

## 2015-06-16 ENCOUNTER — Ambulatory Visit (INDEPENDENT_AMBULATORY_CARE_PROVIDER_SITE_OTHER): Payer: 59 | Admitting: Medical

## 2015-06-16 VITALS — BP 106/76 | HR 84 | Temp 97.9°F | Resp 18 | Ht 63.5 in | Wt 152.2 lb

## 2015-06-16 DIAGNOSIS — K1370 Unspecified lesions of oral mucosa: Secondary | ICD-10-CM

## 2015-06-16 DIAGNOSIS — B850 Pediculosis due to Pediculus humanus capitis: Secondary | ICD-10-CM

## 2015-06-16 DIAGNOSIS — N946 Dysmenorrhea, unspecified: Secondary | ICD-10-CM | POA: Diagnosis not present

## 2015-06-16 DIAGNOSIS — R5383 Other fatigue: Secondary | ICD-10-CM

## 2015-06-16 NOTE — Telephone Encounter (Signed)
Provided information to patient 

## 2015-06-16 NOTE — Progress Notes (Signed)
Subjective: Here for new patient establish care.     2 weeks ago was given medication by the ED for head lice.  Prior to this had dread lock extensions she had put in while in [place] at a salon. Since then had been having itching, went to the ED and was found to have lice.   Ended up cutting hair off as she had dreadlocks.  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  Used lice medication for 10 minutes, left in then washed out combed out, repeated in 8 days.    Has lesion under left tongue that has been there a long time, fils up and drains from time to time, but never goes away.   Hasn't had routine preventative care, wants to return for physical.   She wants me to be aware of her main concern although we can't address it today.  She notes from the time she started having periods, only gets a period every few months, but the period can last a whole month.  This has always been the case.   She has never been on OCPs, actually doesn't want to go on any hormonal medication.  She is willing to use herbs for this.  She has never been sexually active, no prior pap or pelvic exam.  She notes in the past not having the best diet,has been more active athletically in the past, but now is exercising somewhat regularly, not obsessively, and eats healthy diet for the most part.   Born in [place], but father is from [place], and she does travel there including the recent trip where she got the hair extensions.  History reviewed. No pertinent past medical history.  ROS as in subjective   Objective: BP 106/76 mmHg  Pulse 84  Temp(Src) 97.9 F (36.6 C) (Oral)  Resp 18  Ht 5' 3.5" (1.613 m)  Wt 152 lb 3.2 oz (69.037 kg)  BMI 26.53 kg/m2  LMP 04/05/2015  Gen: wd, wn, nad AA female Hair is short, no current extensions or dreadlocks, there seems to be a single nit of left low scalp Underneath left tongue is a 3cm x 1cm raised lesion with possible clear fluid  inside Abdomen: +bs, soft, nontender, no mass, organomegaly    Assessment: Encounter Diagnoses  Name Primary?  . Head lice Yes  . Mouth lesion   . Dysmenorrhea   . Other fatigue     Plan: Head lice - use 1 more round as there seems to be a few rare nits.  Then should be fine.  Discussed precautions for future.    Mouth lesion - f/u with dentist for eval and routine dental care  Dysmenorrhea, fatigue - return soon for physical, labs, and additional eval.

## 2015-06-16 NOTE — Telephone Encounter (Signed)
Refer to Dr. Lawrence Marseilles, dentist office for establish care and to evaluate lesion under tongue   Dr. Yancey Flemings, dentist [address] [place], [place] [address] [phone] [email or web address]

## 2015-06-16 NOTE — Patient Instructions (Signed)
Recommendations See a dentist for routine care and to look at the mouth lesion  Dr. Yancey Flemings, dentist [address], [place], [place] [address] [phone] [email or web address]   Use 1 more application to the scalp of the Nix cream you have  Eat a variety of foods, vegetables, fruits, grains, dairy  Return at your convenience for a physical

## 2015-06-16 NOTE — Telephone Encounter (Signed)
LM to CB

## 2015-06-27 HISTORY — PX: NO PAST SURGERIES: SHX2092

## 2015-06-28 ENCOUNTER — Ambulatory Visit (INDEPENDENT_AMBULATORY_CARE_PROVIDER_SITE_OTHER): Payer: 59 | Admitting: Family Medicine

## 2015-06-28 ENCOUNTER — Encounter: Payer: Self-pay | Admitting: Family Medicine

## 2015-06-28 VITALS — BP 110/70 | HR 78 | Wt 154.0 lb

## 2015-06-28 DIAGNOSIS — S6000XA Contusion of unspecified finger without damage to nail, initial encounter: Secondary | ICD-10-CM

## 2015-06-28 NOTE — Progress Notes (Signed)
   Subjective:    Patient ID: Kathleen Casey, female    DOB: [birth year], 24 y.o.   MRN: [id number]  HPI Yesterday she slammed the car door on her right hand over the third and fourth fingers.   Review of Systems     Objective:   Physical Exam Minimal tenderness to palpation over the mid third and fourth fingers with good motion of the fingers. No swelling or deformity noted.       Assessment & Plan:  Finger contusion, initial encounter  Recommend conservative care. Explained that she is not in any danger and may resume full activity based on discomfort. Recommend Tylenol or Advil for control of symptoms.

## 2015-07-04 ENCOUNTER — Ambulatory Visit (INDEPENDENT_AMBULATORY_CARE_PROVIDER_SITE_OTHER): Payer: 59 | Admitting: Medical

## 2015-07-04 ENCOUNTER — Encounter: Payer: Self-pay | Admitting: Medical

## 2015-07-04 ENCOUNTER — Other Ambulatory Visit: Payer: Self-pay | Admitting: Medical

## 2015-07-04 VITALS — BP 110/68 | HR 68 | Temp 98.4°F | Resp 16 | Ht 64.0 in | Wt 154.8 lb

## 2015-07-04 DIAGNOSIS — Z0000 Encounter for general adult medical examination without abnormal findings: Secondary | ICD-10-CM

## 2015-07-04 DIAGNOSIS — N926 Irregular menstruation, unspecified: Secondary | ICD-10-CM

## 2015-07-04 DIAGNOSIS — Z23 Encounter for immunization: Secondary | ICD-10-CM | POA: Diagnosis not present

## 2015-07-04 DIAGNOSIS — Z7189 Other specified counseling: Secondary | ICD-10-CM | POA: Diagnosis not present

## 2015-07-04 DIAGNOSIS — Z7185 Encounter for immunization safety counseling: Secondary | ICD-10-CM

## 2015-07-04 LAB — COMPREHENSIVE METABOLIC PANEL
ALBUMIN: 4.5 g/dL (ref 3.6–5.1)
ALK PHOS: 71 U/L (ref 33–115)
ALT: 12 U/L (ref 6–29)
AST: 19 U/L (ref 10–30)
BUN: 8 mg/dL (ref 7–25)
CO2: 25 mmol/L (ref 20–31)
Calcium: 9.5 mg/dL (ref 8.6–10.2)
Chloride: 102 mmol/L (ref 98–110)
Creat: 0.81 mg/dL (ref 0.50–1.10)
Glucose, Bld: 80 mg/dL (ref 65–99)
POTASSIUM: 4.2 mmol/L (ref 3.5–5.3)
SODIUM: 138 mmol/L (ref 135–146)
TOTAL PROTEIN: 7.7 g/dL (ref 6.1–8.1)
Total Bilirubin: 0.4 mg/dL (ref 0.2–1.2)

## 2015-07-04 LAB — CBC
HCT: 41.4 % (ref 36.0–46.0)
HEMOGLOBIN: 13.4 g/dL (ref 12.0–15.0)
MCH: 27.3 pg (ref 26.0–34.0)
MCHC: 32.4 g/dL (ref 30.0–36.0)
MCV: 84.5 fL (ref 78.0–100.0)
MPV: 10.4 fL (ref 8.6–12.4)
Platelets: 233 10*3/uL (ref 150–400)
RBC: 4.9 MIL/uL (ref 3.87–5.11)
RDW: 14.9 % (ref 11.5–15.5)
WBC: 4.5 10*3/uL (ref 4.0–10.5)

## 2015-07-04 LAB — LIPID PANEL
Cholesterol: 189 mg/dL (ref 125–200)
HDL: 55 mg/dL (ref >=46)
LDL CALC: 125 mg/dL (ref <130)
TRIGLYCERIDES: 47 mg/dL (ref <150)
Total CHOL/HDL Ratio: 3.4 Ratio (ref <=5.0)
VLDL: 9 mg/dL (ref <30)

## 2015-07-04 LAB — TSH: TSH: 1.295 u[IU]/mL (ref 0.350–4.500)

## 2015-07-04 NOTE — Patient Instructions (Signed)
Recommendations:  Eat 3-4 fruits daily  Eat vegetables, the more color the better - squash, tomatoes, carrots, lettuces, greens, etc.  Cut way back on fried foods, meat, Ch[place] Filet  Exercise daily  Take a multivitamin daily    HPV Vaccine Gardasil (Human Papillomavirus): What You Need to Know 1. What is HPV? Genital human papillomavirus (HPV) is the most common sexually transmitted virus in the [place]. More than half of sexually active men and women are infected with HPV at some time in their lives. About 20 million Americans are currently infected, and about 6 million more get infected each year. HPV is usually spread through sexual contact. Most HPV infections don't cause any symptoms, and go away on their own. But HPV can cause cervical cancer in women. Cervical cancer is the 2nd leading cause of cancer deaths among women around the world. In the [place], about 12,000 women get cervical cancer every year and about 4,000 are expected to die from it. HPV is also associated with several less common cancers, such as vaginal and vulvar cancers in women, and anal and oropharyngeal (back of the throat, including base of tongue and tonsils) cancers in both men and women. HPV can also cause genital warts and warts in the throat. There is no cure for HPV infection, but some of the problems it causes can be treated. 2. HPV vaccine: Why get vaccinated? The HPV vaccine you are getting is one of two vaccines that can be given to prevent HPV. It may be given to both males and females.  This vaccine can prevent most cases of cervical cancer in females, if it is given before exposure to the virus. In addition, it can prevent vaginal and vulvar cancer in females, and genital warts and anal cancer in both males and females. Protection from HPV vaccine is expected to be long-lasting. But vaccination is not a substitute for cervical cancer screening. Women should still get regular Pap tests. 3.  Who should get this HPV vaccine and when? HPV vaccine is given as a 3-dose series  1st Dose: Now  2nd Dose: 1 to 2 months after Dose 1  3rd Dose: 6 months after Dose 1 Additional (booster) doses are not recommended. Routine vaccination  This HPV vaccine is recommended for girls and boys 56 or 24 years of age. It may be given starting at age 67. Why is HPV vaccine recommended at 15 or 24 years of age?  HPV infection is easily acquired, even with only one sex partner. That is why it is important to get HPV vaccine before any sexual contact takes place. Also, response to the vaccine is better at this age than at older ages. Catch-up vaccination This vaccine is recommended for the following people who have not completed the 3-dose series:   Females 13 through 24 years of age.  Males 13 through 24 years of age. This vaccine may be given to men 22 through 24 years of age who have not completed the 3-dose series. It is recommended for men through age 67 who have sex with men or whose immune system is weakened because of HIV infection, other illness, or medications.  HPV vaccine may be given at the same time as other vaccines. 4. Some people should not get HPV vaccine or should wait.  Anyone who has ever had a life-threatening allergic reaction to any component of HPV vaccine, or to a previous dose of HPV vaccine, should not get the vaccine. Tell your doctor  if the person getting vaccinated has any severe allergies, including an allergy to yeast.  HPV vaccine is not recommended for pregnant women. However, receiving HPV vaccine when pregnant is not a reason to consider terminating the pregnancy. Women who are breast feeding may get the vaccine.  People who are mildly ill when a dose of HPV is planned can still be vaccinated. People with a moderate or severe illness should wait until they are better. 5. What are the risks from this vaccine? This HPV vaccine has been used in the [place] and around  the world for about six years and has been very safe. However, any medicine could possibly cause a serious problem, such as a severe allergic reaction. The risk of any vaccine causing a serious injury, or death, is extremely small. Life-threatening allergic reactions from vaccines are very rare. If they do occur, it would be within a few minutes to a few hours after the vaccination. Several mild to moderate problems are known to occur with this HPV vaccine. These do not last long and go away on their own.  Reactions in the arm where the shot was given:  Pain (about 8 people in 10)  Redness or swelling (about 1 person in 4)  Fever:  Mild (100 F) (about 1 person in 10)  Moderate (102 F) (about 1 person in 17)  Other problems:  Headache (about 1 person in 3)  Fainting: Brief fainting spells and related symptoms (such as jerking movements) can happen after any medical procedure, including vaccination. Sitting or lying down for about 15 minutes after a vaccination can help prevent fainting and injuries caused by falls. Tell your doctor if the patient feels dizzy or light-headed, or has vision changes or ringing in the ears.  Like all vaccines, HPV vaccines will continue to be monitored for unusual or severe problems. 6. What if there is a serious reaction? What should I look for?  Look for anything that concerns you, such as signs of a severe allergic reaction, very high fever, or behavior changes. Signs of a severe allergic reaction can include hives, swelling of the face and throat, difficulty breathing, a fast heartbeat, dizziness, and weakness. These would start a few minutes to a few hours after the vaccination.  What should I do?  If you think it is a severe allergic reaction or other emergency that can't wait, call 9-1-1 or get the person to the nearest hospital. Otherwise, call your doctor.  Afterward, the reaction should be reported to the Vaccine Adverse Event Reporting System  (VAERS). Your doctor might file this report, or you can do it yourself through the VAERS web site at www.vaers.LAgents.no, or by calling 1-941-778-2291. VAERS is only for reporting reactions. They do not give medical advice. 7. The National Vaccine Injury Compensation Program  The [employer] Vaccine Injury Compensation Program (VICP) is a federal program that was created to compensate people who may have been injured by certain vaccines.  Persons who believe they may have been injured by a vaccine can learn about the program and about filing a claim by calling 1-205-460-5789 or visiting the VICP website at SpiritualWord.at. 8. How can I learn more?  Ask your doctor.  Call your local or state health department.  Contact the Centers for Disease Control and Prevention (CDC):  Call (531)253-2181 (1-800-CDC-INFO)  or  Visit CDC's website at PicCapture.uy CDC Human Papillomavirus (HPV) Gardasil (Interim) 04/11/12 Document Released: 09/09/2006 Document Revised: 03/29/2014 Document Reviewed: 12/24/2013 [employer] Patient Information 2015 [employer]. This information is not intended to replace advice given to you by your health care provider. Make sure you discuss any questions you have with your health care provider.    Influenza Vaccine (Flu Vaccine, Inactivated or Recombinant) 2014-2015: What You Need to Know 1. Why get vaccinated? Influenza ("flu") is a contagious disease that spreads around the [place] every winter, usually between October and May. Flu is caused by influenza viruses, and is spread mainly by coughing, sneezing, and close contact. Anyone can get flu, but the risk of getting flu is highest among children. Symptoms come on suddenly and may last several days. They can include:  fever/chills  sore throat  muscle aches  fatigue  cough  headache  runny or stuffy nose Flu can make some people much sicker than others. These people include  young children, people 66 and older, pregnant women, and people with certain health conditions-such as heart, lung or kidney disease, nervous system disorders, or a weakened immune system. Flu vaccination is especially important for these people, and anyone in close contact with them. Flu can also lead to pneumonia, and make existing medical conditions worse. It can cause diarrhea and seizures in children. Each year thousands of people in the [place] die from flu, and many more are hospitalized. Flu vaccine is the best protection against flu and its complications. Flu vaccine also helps prevent spreading flu from person to person. 2. Inactivated and recombinant flu vaccines You are getting an injectable flu vaccine, which is either an "inactivated" or "recombinant" vaccine. These vaccines do not contain any live influenza virus. They are given by injection with a needle, and often called the "flu shot."  A different live, attenuated (weakened) influenza vaccine is sprayed into the nostrils. This vaccine is described in a separate Vaccine Information Statement. Flu vaccination is recommended every year. Some children 6 months through 34 years of age might need two doses during one year. Flu viruses are always changing. Each year's flu vaccine is made to protect against 3 or 4 viruses that are likely to cause disease that year. Flu vaccine cannot prevent all cases of flu, but it is the best defense against the disease.  It takes about 2 weeks for protection to develop after the vaccination, and protection lasts several months to a year. Some illnesses that are not caused by influenza virus are often mistaken for flu. Flu vaccine will not prevent these illnesses. It can only prevent influenza. Some inactivated flu vaccine contains a very small amount of a mercury-based preservative called thimerosal. Studies have shown that thimerosal in vaccines is not harmful, but flu vaccines that do not contain a  preservative are available. 3. Some people should not get this vaccine Tell the person who gives you the vaccine:  If you have any severe, life-threatening allergies. If you ever had a life-threatening allergic reaction after a dose of flu vaccine, or have a severe allergy to any part of this vaccine, including (for example) an allergy to gelatin, antibiotics, or eggs, you may be advised not to get vaccinated. Most, but not all, types of flu vaccine contain a small amount of egg protein.  If you ever had Guillain-Barr Syndrome (a severe paralyzing illness, also called GBS). Some people with a history of GBS should not get this vaccine. This should be discussed with your doctor.  If you are not feeling well. It is usually okay to get flu vaccine when you have a mild illness, but you might  be advised to wait until you feel better. You should come back when you are better. 4. Risks of a vaccine reaction With a vaccine, like any medicine, there is a chance of side effects. These are usually mild and go away on their own. Problems that could happen after any vaccine:  Brief fainting spells can happen after any medical procedure, including vaccination. Sitting or lying down for about 15 minutes can help prevent fainting, and injuries caused by a fall. Tell your doctor if you feel dizzy, or have vision changes or ringing in the ears.  Severe shoulder pain and reduced range of motion in the arm where a shot was given can happen, very rarely, after a vaccination.  Severe allergic reactions from a vaccine are very rare, estimated at less than 1 in a million doses. If one were to occur, it would usually be within a few minutes to a few hours after the vaccination. Mild problems following inactivated flu vaccine:  soreness, redness, or swelling where the shot was given  hoarseness  sore, red or itchy eyes  cough  fever  aches  headache  itching  fatigue If these problems occur, they usually  begin soon after the shot and last 1 or 2 days. Moderate problems following inactivated flu vaccine:  Young children who get inactivated flu vaccine and pneumococcal vaccine (PCV13) at the same time may be at increased risk for seizures caused by fever. Ask your doctor for more information. Tell your doctor if a child who is getting flu vaccine has ever had a seizure. Inactivated flu vaccine does not contain live flu virus, so you cannot get the flu from this vaccine. As with any medicine, there is a very remote chance of a vaccine causing a serious injury or death. The safety of vaccines is always being monitored. For more information, visit: http://floyd.org/ 5. What if there is a serious reaction? What should I look for?  Look for anything that concerns you, such as signs of a severe allergic reaction, very high fever, or behavior changes. Signs of a severe allergic reaction can include hives, swelling of the face and throat, difficulty breathing, a fast heartbeat, dizziness, and weakness. These would start a few minutes to a few hours after the vaccination. What should I do?  If you think it is a severe allergic reaction or other emergency that can't wait, call 9-1-1 and get the person to the nearest hospital. Otherwise, call your doctor.  Afterward, the reaction should be reported to the Vaccine Adverse Event Reporting System (VAERS). Your doctor should file this report, or you can do it yourself through the VAERS web site at www.vaers.LAgents.no, or by calling 1-484-358-2359. VAERS does not give medical advice. 6. The National Vaccine Injury Compensation Program The [employer] Vaccine Injury Compensation Program (VICP) is a federal program that was created to compensate people who may have been injured by certain vaccines. Persons who believe they may have been injured by a vaccine can learn about the program and about filing a claim by calling 1-(458) 217-6384 or visiting the VICP  website at SpiritualWord.at. There is a time limit to file a claim for compensation. 7. How can I learn more?  Ask your health care provider.  Call your local or state health department.  Contact the Centers for Disease Control and Prevention (CDC):  Call (321) 877-9052 (1-800-CDC-INFO) or  Visit CDC's website at BiotechRoom.com.cy CDC Vaccine Information Statement (Interim) Inactivated Influenza Vaccine (07/14/2013) Document Released: 09/06/2006 Document Revised: 03/29/2014 Document Reviewed:  10/30/2013 ExitCare Patient Information 2015 [place], [place]. This information is not intended to replace advice given to you by your health care provider. Make sure you discuss any questions you have with your health care provider.    Td Vaccine (Tetanus and Diphtheria): What You Need to Know 1. Why get vaccinated? Tetanus  and diphtheria are very serious diseases. They are rare in the [place] today, but people who do become infected often have severe complications. Td vaccine is used to protect adolescents and adults from both of these diseases. Both tetanus and diphtheria are infections caused by bacteria. Diphtheria spreads from person to person through coughing or sneezing. Tetanus-causing bacteria enter the body through cuts, scratches, or wounds. TETANUS (Lockjaw) causes painful muscle tightening and stiffness, usually all over the body.  It can lead to tightening of muscles in the head and neck so you can't open your mouth, swallow, or sometimes even breathe. Tetanus kills about 1 out of every 5 people who are infected. DIPHTHERIA can cause a thick coating to form in the back of the throat.  It can lead to breathing problems, paralysis, heart failure, and death. Before vaccines, the [place] saw as many as 200,000 cases a year of diphtheria and hundreds of cases of tetanus. Since vaccination began, cases of both diseases have dropped by about 99%. 2. Td vaccine Td  vaccine can protect adolescents and adults from tetanus and diphtheria. Td is usually given as a booster dose every 10 years but it can also be given earlier after a severe and dirty wound or burn. Your doctor can give you more information. Td may safely be given at the same time as other vaccines. 3. Some people should not get this vaccine  If you ever had a life-threatening allergic reaction after a dose of any tetanus or diphtheria containing vaccine, OR if you have a severe allergy to any part of this vaccine, you should not get Td. Tell your doctor if you have any severe allergies.  Talk to your doctor if you:  have epilepsy or another nervous system problem,  had severe pain or swelling after any vaccine containing diphtheria or tetanus,  ever had Guillain Barr Syndrome (GBS),  aren't feeling well on the day the shot is scheduled. 4. Risks of a vaccine reaction With a vaccine, like any medicine, there is a chance of side effects. These are usually mild and go away on their own. Serious side effects are also possible, but are very rare. Most people who get Td vaccine do not have any problems with it. Mild Problems  following Td (Did not interfere with activities)  Pain where the shot was given (about 8 people in 10)  Redness or swelling where the shot was given (about 1 person in 3)  Mild fever (about 1 person in 15)  Headache or Tiredness (uncommon) Moderate Problems following Td (Interfered with activities, but did not require medical attention)  Fever over 102F (rare) Severe Problems  following Td (Unable to perform usual activities; required medical attention)  Swelling, severe pain, bleeding and/or redness in the arm where the shot was given (rare). Problems that could happen after any vaccine:  Brief fainting spells can happen after any medical procedure, including vaccination. Sitting or lying down for about 15 minutes can help prevent fainting, and injuries  caused by a fall. Tell your doctor if you feel dizzy, or have vision changes or ringing in the ears.  Severe shoulder pain and reduced range of  motion in the arm where a shot was given can happen, very rarely, after a vaccination.  Severe allergic reactions from a vaccine are very rare, estimated at less than 1 in a million doses. If one were to occur, it would usually be within a few minutes to a few hours after the vaccination. 5. What if there is a serious reaction? What should I look for?  Look for anything that concerns you, such as signs of a severe allergic reaction, very high fever, or behavior changes. Signs of a severe allergic reaction can include hives, swelling of the face and throat, difficulty breathing, a fast heartbeat, dizziness, and weakness. These would usually start a few minutes to a few hours after the vaccination. What should I do?  If you think it is a severe allergic reaction or other emergency that can't wait, call 9-1-1 or get the person to the nearest hospital. Otherwise, call your doctor.  Afterward, the reaction should be reported to the Vaccine Adverse Event Reporting System (VAERS). Your doctor might file this report, or you can do it yourself through the VAERS web site at www.vaers.LAgents.no, or by calling 1-(854) 059-4525. VAERS is only for reporting reactions. They do not give medical advice. 6. The National Vaccine Injury Compensation Program The [employer] Vaccine Injury Compensation Program (VICP) is a federal program that was created to compensate people who may have been injured by certain vaccines. Persons who believe they may have been injured by a vaccine can learn about the program and about filing a claim by calling 1-319-765-4840 or visiting the VICP website at SpiritualWord.at. 7. How can I learn more?  Ask your doctor.  Contact your local or state health department.  Contact the Centers for Disease Control and Prevention  (CDC):  Call (917)771-4259 (1-800-CDC-INFO)  Visit CDC's website at PicCapture.uy CDC Td Vaccine Interim VIS (12/30/12) Document Released: 09/09/2006 Document Revised: 03/29/2014 Document Reviewed: 02/24/2014 [facility] Patient Information 2015 [place], [place]. This information is not intended to replace advice given to you by your health care provider. Make sure you discuss any questions you have with your health care provider.

## 2015-07-04 NOTE — Progress Notes (Signed)
Subjective:   HPI  Kathleen Casey is a 24 y.o. female who presents for a complete physical.   Preventative care: Last ophthalmology visit: due, none within last year Last dental visit: not recently Last colonoscopy: Last gynecological exam: never  Prior vaccinations: TD or Tdap: around age 65yo Influenza: declines  Concerns: She notes from the time she started having periods, only gets a period every few months, but the period can last a whole month. This has always been the case. She has never been on OCPs, actually doesn't want to go on any hormonal medication. She is willing to use herbs for this. She has never been sexually active, no prior pap or pelvic exam. She notes in the past not having the best diet,has been more active athletically in the past, but now is exercising somewhat regularly, not obsessively, and eats healthy diet for the most part.  LMP - currently spotting.    Born in [place], but father is from [place], and she does travel there including the recent trip where she got the hair extensions.  History reviewed. No pertinent past medical history.  ROS as in subjective  Reviewed their medical, surgical, family, social, medication, and allergy history and updated chart as appropriate.  Past Medical History  Diagnosis Date  . Anxiety   . Wears contact lenses     Past Surgical History  Procedure Laterality Date  . No past surgeries  06/2015    History   Social History  . Marital Status: Single    Spouse Name: N/A  . Number of Children: N/A  . Years of Education: N/A   Occupational History  . Not on file.   Social History Main Topics  . Smoking status: Never Smoker   . Smokeless tobacco: Not on file  . Alcohol Use: Yes     Comment: rarely  . Drug Use: No  . Sexual Activity: No     Comment: never as of 06/2015   Other Topics Concern  . Not on file   Social History Narrative   Single, full time hair stylist, natural hair stylist.   Exercise - running.      Family History  Problem Relation Age of Onset  . Diabetes Neg Hx   . Stroke Neg Hx   . Hypertension Mother   . Cancer Maternal Grandmother   . Heart disease Maternal Grandfather 71    died of MI     Current outpatient prescriptions:  .  ibuprofen (ADVIL,MOTRIN) 200 MG tablet, Take 400 mg by mouth every 6 (six) hours as needed for moderate pain., Disp: , Rfl:   No Known Allergies     Review of Systems Constitutional: -fever, -chills, -sweats, -unexpected weight change, -decreased appetite, -fatigue Allergy: -sneezing, -itching, -congestion Dermatology: -changing moles, --rash, -lumps ENT: -runny nose, -ear pain, -sore throat, -hoarseness, -sinus pain, -teeth pain, - ringing in ears, -hearing loss, -nosebleeds Cardiology: -chest pain, -palpitations, -swelling, -difficulty breathing when lying flat, -waking up short of breath Respiratory: -cough, -shortness of breath, -difficulty breathing with exercise or exertion, -wheezing, -coughing up blood Gastroenterology: -abdominal pain, -nausea, -vomiting, -diarrhea, -constipation, -blood in stool, -changes in bowel movement, -difficulty swallowing or eating Hematology: -bleeding, -bruising  Musculoskeletal: -joint aches, -muscle aches, -joint swelling, -back pain, -neck pain, -cramping, -changes in gait Ophthalmology: denies vision changes, eye redness, itching, discharge Urology: -burning with urination, -difficulty urinating, -blood in urine, -urinary frequency, -urgency, -incontinence Neurology: -headache, -weakness, -tingling, -numbness, -memory loss, -falls, -dizziness Psychology: -depressed mood, -agitation, -sleep  problems     Objective:   Physical Exam  BP 110/68 mmHg  Pulse 68  Temp(Src) 98.4 F (36.9 C) (Oral)  Resp 16  Ht 5\' 4"  (1.626 m)  Wt 154 lb 12.8 oz (70.217 kg)  BMI 26.56 kg/m2  LMP 04/05/2015  General appearance: alert, no distress, WD/WN, AA female Skin: no worrisome  lesions HEENT: normocephalic, conjunctiva/corneas normal, sclerae anicteric, PERRLA, EOMi, nares patent, no discharge or erythema, pharynx normal Oral cavity: MMM, tongue normal, teeth normal Neck: supple, no lymphadenopathy, no thyromegaly, no masses, normal ROM Chest: non tender, normal shape and expansion Heart: RRR, normal S1, S2, no murmurs Lungs: CTA bilaterally, no wheezes, rhonchi, or rales Abdomen: +bs, soft, non tender, non distended, no masses, no hepatomegaly, no splenomegaly, no bruits Back: non tender, normal ROM, no scoliosis Musculoskeletal: upper extremities non tender, no obvious deformity, normal ROM throughout, lower extremities non tender, no obvious deformity, normal ROM throughout Extremities: no edema, no cyanosis, no clubbing Pulses: 2+ symmetric, upper and lower extremities, normal cap refill Neurological: alert, oriented x 3, CN2-12 intact, strength normal upper extremities and lower extremities, sensation normal throughout, DTRs 2+ throughout, no cerebellar signs, gait normal Psychiatric: normal affect, behavior normal, pleasant  Breast: not examined Gyn: Normal external genitalia without lesions.  Not examined otherwise.  Exam chaperoned by nurse. Rectal: deferred    Assessment and Plan :    Encounter Diagnoses  Name Primary?  . Encounter for health maintenance examination in adult Yes  . Need for TD vaccine   . Vaccine counseling   . Irregular periods    Physical exam - discussed healthy lifestyle, diet, exercise, preventative care, vaccinations, and addressed their concerns.  Handout given.  Counseled on the Td (tetanus, diptheria) vaccine.  Vaccine information sheet given. Td vaccine given after consent obtained.  Advised yearly flu shot, HPV vaccine series.  She will consider and let us know  Irregular periods - labs today.  discussed possible causes.   Strongly recommended diet changes.   Consider OCPs although she is not really interested in this.   discussed safe sex, prevention if she becomes sexually active.   Routine labs today.  Follow-up pending labs

## 2015-07-05 LAB — TESTOSTERONE, TOTAL AND FREE DIRECT MEASURE: Testosterone: 57 ng/dL (ref 10–70)

## 2015-07-05 LAB — HEMOGLOBIN A1C
Hgb A1c MFr Bld: 5.7 % — ABNORMAL HIGH (ref <5.7)
MEAN PLASMA GLUCOSE: 117 mg/dL — AB (ref <117)

## 2015-07-05 LAB — TESTOSTERONE, FREE

## 2015-07-06 ENCOUNTER — Other Ambulatory Visit: Payer: Self-pay | Admitting: Medical

## 2015-07-06 DIAGNOSIS — N926 Irregular menstruation, unspecified: Secondary | ICD-10-CM

## 2015-07-07 LAB — TESTOSTERONE, FREE, TOTAL, SHBG
Sex Hormone Binding: 37 nmol/L (ref 17–124)
TESTOSTERONE FREE: 9.6 pg/mL — AB (ref 0.6–6.8)
Testosterone-% Free: 1.7 % (ref 0.4–2.4)
Testosterone: 57 ng/dL (ref 10–70)

## 2015-07-08 ENCOUNTER — Other Ambulatory Visit: Payer: Self-pay | Admitting: Medical

## 2015-07-08 DIAGNOSIS — N915 Oligomenorrhea, unspecified: Secondary | ICD-10-CM

## 2015-07-08 DIAGNOSIS — E288 Other ovarian dysfunction: Secondary | ICD-10-CM

## 2015-07-08 LAB — FSH/LH
FSH: 6 m[IU]/mL
LH: 23 m[IU]/mL

## 2015-07-08 LAB — DHEA-SULFATE: DHEA-SO4: 122 ug/dL (ref 18–391)

## 2015-07-08 LAB — PROLACTIN: Prolactin: 3.7 ng/mL

## 2015-07-08 MED ORDER — METFORMIN HCL 500 MG PO TABS: ORAL_TABLET | ORAL | Status: AC

## 2015-07-08 MED ORDER — METFORMIN HCL 500 MG PO TABS: 500.0000 mg | ORAL_TABLET | Freq: Every day | ORAL | Status: DC

## 2015-07-12 LAB — ESTROGENS, TOTAL: Estrogen: 156 pg/mL

## 2016-01-12 ENCOUNTER — Ambulatory Visit (INDEPENDENT_AMBULATORY_CARE_PROVIDER_SITE_OTHER): Payer: Self-pay | Admitting: Medical

## 2016-01-12 ENCOUNTER — Encounter: Payer: Self-pay | Admitting: Medical

## 2016-01-12 VITALS — BP 118/70 | HR 94 | Wt 160.0 lb

## 2016-01-12 DIAGNOSIS — L210 Seborrhea capitis: Secondary | ICD-10-CM

## 2016-01-12 DIAGNOSIS — L219 Seborrheic dermatitis, unspecified: Secondary | ICD-10-CM

## 2016-01-12 DIAGNOSIS — L299 Pruritus, unspecified: Secondary | ICD-10-CM

## 2016-01-12 NOTE — Progress Notes (Signed)
Subjective: Chief Complaint  Patient presents with  . Head Lice    woke up with a big on her bed and freaked out since she had lice over the summer. not sure if it is lice or not is washing her hair once a week which is more than normal.    Woke up this morning with bug on her bed, it was crawling, tan colored.  She freaked out.  Her sister noticed it, thought she had lice.   She is a Producer, television/film/video, does wear wigs, gets dry scalp and itching.  She was seen here for lice months ago after she had gotten [place] hair bra[place] from [place].  So she was paranoid she had lice again when sister mentioned the bug.    She washes hair once a week.  Objective: BP 118/70 mmHg  Pulse 94  Wt 160 lb (72.576 kg)  LMP 12/27/2015  Gen: wd, wn, nad Skin: no rash Hair and scalp - dandruf present but no obvious lice    Assessment : Encounter Diagnoses  Name Primary?  . Dandruff Yes  . Itchy scalp     Plan: Discussed appearance and exam findings for lice vs dandruf, reassured. Also discussed general cleaning and hygiene around the house to avoid unwanted insects such as roaches, spiders, etc.   F/u prn.

## 2017-04-15 ENCOUNTER — Ambulatory Visit (INDEPENDENT_AMBULATORY_CARE_PROVIDER_SITE_OTHER): Payer: Self-pay | Admitting: Medical

## 2017-04-15 ENCOUNTER — Encounter: Payer: Self-pay | Admitting: Medical

## 2017-04-15 VITALS — BP 110/60 | HR 72 | Wt 171.6 lb

## 2017-04-15 DIAGNOSIS — M25551 Pain in right hip: Secondary | ICD-10-CM

## 2017-04-15 DIAGNOSIS — Z789 Other specified health status: Secondary | ICD-10-CM

## 2017-04-15 DIAGNOSIS — M25552 Pain in left hip: Secondary | ICD-10-CM | POA: Insufficient documentation

## 2017-04-15 DIAGNOSIS — M545 Low back pain, unspecified: Secondary | ICD-10-CM

## 2017-04-15 DIAGNOSIS — N939 Abnormal uterine and vaginal bleeding, unspecified: Secondary | ICD-10-CM

## 2017-04-15 DIAGNOSIS — N920 Excessive and frequent menstruation with regular cycle: Secondary | ICD-10-CM

## 2017-04-15 NOTE — Progress Notes (Signed)
Subjective: Chief Complaint  Patient presents with  . hip pain    hip pain , she also having bleeding , irreaglur periods   Here for vaginal spotting and hip pains.  She notes having some swelling in her hips or lady parts, bilat for at least a month.  Has bilat hip/pelvis pains, pain that radiates into both legs down to feet. Sometimes gets arm pains as well .   Can't do her job as normal.  Denies injury, no fall.  Sometime ankles hurt.   occasionally has pain in left hand but does cosmetology but no other pains or swelling in hands or wrists.   No family history of RA or other auto immune disease.  Standing on feet typically 12 hours all day long, typically 5 days per week, sometimes 7.  In school and working full time, both related to cosmetology.  Has low back pain sometimes as well.  She does exercise on treadmill several days per week.  She notes hx/o irregular periods, however in recent months periods have been regular.   The only recent c/o is she gets spotting when exercising.    not sexually active, never been sexually active.  No vaginal discharge.   She had taken metformin for a brief period of time for possibility of PCOS given last visit symptoms 2 years ago of irregular periods. She declined OCP s at that time.     Having BMs daily, no GI c/o, no problems with urination   Past Medical History:  Diagnosis Date  . Anxiety   . Wears contact lenses    Current Outpatient Prescriptions on File Prior to Visit  Medication Sig Dispense Refill  . ibuprofen (ADVIL,MOTRIN) 200 MG tablet Take 400 mg by mouth every 6 (six) hours as needed for moderate pain. Reported on 01/12/2016    . metFORMIN (GLUCOPHAGE) 500 MG tablet 1 tablet po daily (Patient not taking: Reported on 01/12/2016) 30 tablet 2   No current facility-administered medications on file prior to visit.    Family History  Problem Relation Age of Onset  . Diabetes Neg Hx   . Stroke Neg Hx   . Hypertension Mother   . Cancer  Maternal Grandmother   . Heart disease Maternal Grandfather 55       died of MI   ROS as in subjective   Objective: BP 110/60   Pulse 72   Wt 171 lb 9.6 oz (77.8 kg)   SpO2 99%   BMI 29.46 kg/m   Wt Readings from Last 3 Encounters:  04/15/17 171 lb 9.6 oz (77.8 kg)  01/12/16 160 lb (72.6 kg)  07/04/15 154 lb 12.8 oz (70.2 kg)     General appearance: alert, no distress, WD/WN,  Neck: supple, no lymphadenopathy, no thyromegaly, no masses Heart: RRR, normal S1, S2, no murmurs Lungs: CTA bilaterally, no wheezes, rhonchi, or rales Abdomen: +bs, soft, non tender, non distended, no masses, no hepatomegaly, no splenomegaly Back: non tender, normal ROM, no deformity Musculoskeletal: legs and hips nontender, no swelling, no obvious deformity, normal leg joint ROM Extremities: no edema, no cyanosis, no clubbing Pulses: 2+ symmetric, upper and lower extremities, normal cap refill Neurological: alert, oriented x 3, CN2-12 intact, strength normal upper extremities and lower extremities, sensation normal throughout, DTRs 2+ throughout, no cerebellar signs, gait normal Psychiatric: normal affect, behavior normal, pleasant  GU: declined   Assessment: Encounter Diagnoses  Name Primary?  . Hip pain, bilateral Yes  . Low back pain without sciatica, unspecified  back pain laterality, unspecified chronicity   . Prolonged standing   . Vaginal spotting     Plan: Discussed her symptoms and concerns.  Wrote letter for school to allow breaks if possible given standing for 12 hours daily at clinic and at work which is likely causing her hip and low back pain.  She has been otherwise healthy.  Discussed core strengthening and stretching exercises for her to do regularly.  If not improving then recheck.  No obvious indication for xray or imaging today.  Doubt other differential for hip pain.  She is young and healthy , no suspected autoimmune or rheumatid disease.   F/u in 109mo  Vaginal spotting - she  has no insurance.   She can either check with health dept about eval to save costs, or she will return here for pap, further eval  Kathleen Casey was seen today for hip pain.  Diagnoses and all orders for this visit:  Hip pain, bilateral  Low back pain without sciatica, unspecified back pain laterality, unspecified chronicity  Prolonged standing  Vaginal spotting

## 2018-02-03 ENCOUNTER — Ambulatory Visit: Payer: Self-pay | Admitting: Family Medicine

## 2018-02-18 ENCOUNTER — Telehealth: Payer: Self-pay | Admitting: Medical

## 2018-02-18 NOTE — Telephone Encounter (Signed)
Schedule for physical 

## 2018-02-19 NOTE — Telephone Encounter (Signed)
Called and left voice mail, that she needed to schedule a cpe

## 2018-04-25 DIAGNOSIS — J029 Acute pharyngitis, unspecified: Secondary | ICD-10-CM | POA: Insufficient documentation

## 2018-04-26 ENCOUNTER — Other Ambulatory Visit: Payer: Self-pay

## 2018-04-26 ENCOUNTER — Encounter (HOSPITAL_COMMUNITY): Payer: Self-pay | Admitting: Emergency Medicine

## 2018-04-26 ENCOUNTER — Emergency Department (HOSPITAL_COMMUNITY)
Admission: EM | Admit: 2018-04-26 | Discharge: 2018-04-26 | Disposition: A | Discharge status: Home / Self Care | Payer: No Typology Code available for payment source | Attending: Emergency Medicine | Admitting: Emergency Medicine

## 2018-04-26 DIAGNOSIS — J029 Acute pharyngitis, unspecified: Secondary | ICD-10-CM

## 2018-04-26 LAB — GROUP A STREP BY PCR: Group A Strep by PCR: NOT DETECTED

## 2018-04-26 NOTE — ED Triage Notes (Signed)
Patient complaining of sore throat and her ears has been hurting. Patient has a hx of acid reflux. Patient complaining of it being hard to swallow.

## 2018-04-26 NOTE — ED Provider Notes (Signed)
[facility]-EMERGENCY DEPT Provider Note   CSN: [id number] Arrival date & time: 04/25/18  2150     History   Chief Complaint Chief Complaint  Patient presents with  . Sore Throat    HPI Kathleen Casey is a 27 y.o. female.  Patient presents to the emergency department with chief complaint of sore throat.  She states that it has been ongoing since March.  She denies any fevers, chills, rhinorrhea, or cough.  She states that she has had acid reflux, but does not take her medication for this.  She denies any seasonal allergies.  Denies any other associated symptoms.  There are no other aggravating or alleviating factors.  The history is provided by the patient. No language interpreter was used.    Past Medical History:  Diagnosis Date  . Anxiety   . Wears contact lenses     Patient Active Problem List   Diagnosis Date Noted  . Hip pain, bilateral 04/15/2017  . Low back pain without sciatica 04/15/2017  . Prolonged standing 04/15/2017  . Vaginal spotting 04/15/2017  . LEG EDEMA, BILATERAL 12/22/2010    Past Surgical History:  Procedure Laterality Date  . NO PAST SURGERIES  06/2015     OB History   None      Home Medications    Prior to Admission medications   Medication Sig Start Date End Date Taking? Authorizing Provider  ibuprofen (ADVIL,MOTRIN) 200 MG tablet Take 400 mg by mouth every 6 (six) hours as needed for moderate pain. Reported on 01/12/2016    [provider]  metFORMIN (GLUCOPHAGE) 500 MG tablet 1 tablet po daily Patient not taking: Reported on 01/12/2016 07/08/15   Tysinger, Kermit Baloavid S, PA-C    Family History Family History  Problem Relation Age of Onset  . Hypertension Mother   . Cancer Maternal Grandmother   . Heart disease Maternal Grandfather 55       died of MI  . Diabetes Neg Hx   . Stroke Neg Hx     Social History Social History   Tobacco Use  . Smoking status: Never Smoker  . Smokeless tobacco: Never Used    Substance Use Topics  . Alcohol use: Yes    Comment: rarely  . Drug use: No     Allergies   Patient has no known allergies.   Review of Systems Review of Systems  All other systems reviewed and are negative.    Physical Exam Updated Vital Signs BP (!) 126/100 (BP Location: Right Arm)   Pulse 80   Temp 98.4 F (36.9 C) (Oral)   Resp 16   Ht 5' 3.5" (1.613 m)   Wt 79.2 kg (174 lb 9.6 oz)   LMP 04/26/2018   SpO2 100%   BMI 30.44 kg/m   Physical Exam  Constitutional: She is oriented to person, place, and time. She appears well-developed and well-nourished.  HENT:  Head: Normocephalic and atraumatic.  Oropharynx is clear, no tonsillar exudate or abscess, no edema, normal phonation  Eyes: Conjunctivae and EOM are normal.  Neck: Normal range of motion.  No cervical adenopathy  Cardiovascular: Normal rate.  Pulmonary/Chest: Effort normal.  Abdominal: She exhibits no distension.  Musculoskeletal: Normal range of motion.  Neurological: She is alert and oriented to person, place, and time.  Skin: Skin is dry.  Psychiatric: She has a normal mood and affect. Her behavior is normal. Judgment and thought content normal.  Nursing note and vitals reviewed.    ED  Treatments / Results  Labs (all labs ordered are listed, but only abnormal results are displayed) Labs Reviewed  GROUP A STREP BY PCR    EKG None  Radiology No results found.  Procedures Procedures (including critical care time)  Medications Ordered in ED Medications - No data to display   Initial Impression / Assessment and Plan / ED Course  I have reviewed the triage vital signs and the nursing notes.  Pertinent labs & imaging results that were available during my care of the patient were reviewed by me and considered in my medical decision making (see chart for details).     Patient was sore throat.  Strep test negative.  Has been ongoing for 3 months.  Afebrile.  No concerning physical exam  findings.  Final Clinical Impressions(s) / ED Diagnoses   Final diagnoses:  Sore throat    ED Discharge Orders    None       Roxy Horseman, PA-C 04/26/18 0304    Palumbo, April, MD 04/26/18 [phone]

## 2018-04-26 NOTE — Discharge Instructions (Addendum)
It is unclear what is causing your sore throat.  Please follow-up with your doctor or the ENT.  Your strep test is negative.  Please take your acid reflux medicine.

## 2018-04-29 ENCOUNTER — Ambulatory Visit: Payer: Self-pay | Admitting: Family Medicine

## 2018-05-02 ENCOUNTER — Encounter: Payer: Self-pay | Admitting: Family Medicine

## 2018-07-31 ENCOUNTER — Telehealth: Payer: Self-pay | Admitting: Medical

## 2018-07-31 NOTE — Telephone Encounter (Signed)
Please get in touch with the patient about recent visit to the emergency department.  Remind them to use us first for non emergency issues.  The emergency dept is expensive, should be used primarily for emergencies, and they should be encouraged to come here for non emergency problems.  This saves every body money, saves them time, and is better for continuity of care.  If they are ever unsure, they can always call here first, unless after hours or weekends.   

## 2018-08-04 ENCOUNTER — Emergency Department (HOSPITAL_COMMUNITY)
Admission: EM | Admit: 2018-08-04 | Discharge: 2018-08-04 | Disposition: A | Discharge status: Home / Self Care | Payer: Self-pay | Attending: Emergency Medicine | Admitting: Emergency Medicine

## 2018-08-04 ENCOUNTER — Emergency Department (HOSPITAL_COMMUNITY): Payer: Self-pay

## 2018-08-04 ENCOUNTER — Other Ambulatory Visit: Payer: Self-pay

## 2018-08-04 DIAGNOSIS — R0789 Other chest pain: Secondary | ICD-10-CM | POA: Insufficient documentation

## 2018-08-04 DIAGNOSIS — R252 Cramp and spasm: Secondary | ICD-10-CM | POA: Insufficient documentation

## 2018-08-04 DIAGNOSIS — G44219 Episodic tension-type headache, not intractable: Secondary | ICD-10-CM | POA: Insufficient documentation

## 2018-08-04 LAB — BASIC METABOLIC PANEL
ANION GAP: 11 (ref 5–15)
BUN: 10 mg/dL (ref 6–20)
CALCIUM: 9.6 mg/dL (ref 8.9–10.3)
CO2: 26 mmol/L (ref 22–32)
Chloride: 102 mmol/L (ref 98–111)
Creatinine, Ser: 0.97 mg/dL (ref 0.44–1.00)
GFR calc Af Amer: >60 mL/min (ref >60)
Glucose, Bld: 93 mg/dL (ref 70–99)
POTASSIUM: 3.7 mmol/L (ref 3.5–5.1)
SODIUM: 139 mmol/L (ref 135–145)

## 2018-08-04 LAB — I-STAT TROPONIN, ED
TROPONIN I, POC: 0 ng/mL (ref 0.00–0.08)
Troponin i, poc: 0 ng/mL (ref 0.00–0.08)

## 2018-08-04 LAB — I-STAT BETA HCG BLOOD, ED (MC, WL, AP ONLY)

## 2018-08-04 LAB — CBC
HEMATOCRIT: 40.4 % (ref 36.0–46.0)
Hemoglobin: 12.4 g/dL (ref 12.0–15.0)
MCH: 25.6 pg — ABNORMAL LOW (ref 26.0–34.0)
MCHC: 30.7 g/dL (ref 30.0–36.0)
MCV: 83.5 fL (ref 78.0–100.0)
Platelets: 257 10*3/uL (ref 150–400)
RBC: 4.84 MIL/uL (ref 3.87–5.11)
RDW: 15 % (ref 11.5–15.5)
WBC: 7.4 10*3/uL (ref 4.0–10.5)

## 2018-08-04 MED ORDER — IBUPROFEN 400 MG PO TABS
600.0000 mg | ORAL_TABLET | Freq: Once | ORAL | Status: AC
  Administered 2018-08-04: 600 mg via ORAL
  Filled 2018-08-04: qty 1

## 2018-08-04 NOTE — ED Triage Notes (Signed)
Patient c/o feeling shaky, weak, chest pain, shortness of breath, etc. States that she had "lock-jaw" last week. These symptoms are intermittent over one week.

## 2018-08-04 NOTE — ED Provider Notes (Signed)
[facility] EMERGENCY DEPARTMENT Provider Note  CSN: [id number] Arrival date & time: 08/04/18 0024  Chief Complaint(s) Multiple Complaints  HPI Kathleen Casey is a 27 y.o. female   The history is provided by the patient.  Chest Pain   This is a new problem. The current episode started 6 to 12 hours ago. The problem occurs constantly. The problem has been resolved. Pain location: left upper chest. The pain is moderate. The quality of the pain is described as sharp and brief. The pain does not radiate. Episode Length: a few minutes. Exacerbated by: nothing. Associated symptoms include headaches and shortness of breath. Pertinent negatives include no back pain, no cough, no dizziness, no fever, no leg pain, no lower extremity edema, no nausea, no palpitations and no vomiting. She has tried nothing for the symptoms.  Pertinent negatives for past medical history include no CAD, no CHF, no diabetes, no DVT, no hyperlipidemia, no hypertension, no MI and no PE.  Headache   This is a recurrent problem. Episode onset: 1 week. Episode frequency: intermittent. Progression since onset: fluctuating. The headache is associated with nothing. The quality of the pain is described as dull and throbbing. The pain is moderate. The pain does not radiate ( migratory). Associated symptoms include shortness of breath. Pertinent negatives include no fever, no palpitations, no nausea and no vomiting. She has tried NSAIDs for the symptoms. Improvement on treatment: resolved headache from earlier in the week. has not taken any today.   She also reports intermittent and sporadic muscle twitching and cramping throughout her body; migratory. Resolved at this time.  Past Medical History Past Medical History:  Diagnosis Date  . Anxiety   . Wears contact lenses    Patient Active Problem List   Diagnosis Date Noted  . Hip pain, bilateral 04/15/2017  . Low back pain without sciatica 04/15/2017  . Prolonged  standing 04/15/2017  . Vaginal spotting 04/15/2017  . LEG EDEMA, BILATERAL 12/22/2010   Home Medication(s) Prior to Admission medications   Medication Sig Start Date End Date Taking? Authorizing Provider  ibuprofen (ADVIL,MOTRIN) 200 MG tablet Take 400 mg by mouth every 6 (six) hours as needed for moderate pain. Reported on 01/12/2016    [provider]  metFORMIN (GLUCOPHAGE) 500 MG tablet 1 tablet po daily Patient not taking: Reported on 01/12/2016 07/08/15   Tysinger, Kermit Balo, PA-C                                                                                                                                    Past Surgical History Past Surgical History:  Procedure Laterality Date  . NO PAST SURGERIES  06/2015   Family History Family History  Problem Relation Age of Onset  . Hypertension Mother   . Cancer Maternal Grandmother   . Heart disease Maternal Grandfather 55       died of MI  . Diabetes Neg Hx   .  Stroke Neg Hx     Social History Social History   Tobacco Use  . Smoking status: Never Smoker  . Smokeless tobacco: Never Used  Substance Use Topics  . Alcohol use: Yes    Comment: rarely  . Drug use: No   Allergies Patient has no known allergies.  Review of Systems Review of Systems  Constitutional: Negative for fever.  Respiratory: Positive for shortness of breath. Negative for cough.   Cardiovascular: Positive for chest pain. Negative for palpitations.  Gastrointestinal: Negative for nausea and vomiting.  Musculoskeletal: Negative for back pain.  Neurological: Positive for headaches. Negative for dizziness.   All other systems are reviewed and are negative for acute change except as noted in the HPI  Physical Exam Vital Signs  I have reviewed the triage vital signs BP 113/67 (BP Location: Right Arm)   Pulse 72   Temp 99 F (37.2 C) (Oral)   Resp 16   Ht 5\' 4"  (1.626 m)   Wt 79.8 kg   LMP 06/24/2018   SpO2 99%   BMI 30.21 kg/m   Physical  Exam  Constitutional: She is oriented to person, place, and time. She appears well-developed and well-nourished. No distress.  HENT:  Head: Normocephalic and atraumatic.  Nose: Nose normal.  Eyes: Pupils are equal, round, and reactive to light. Conjunctivae and EOM are normal. Right eye exhibits no discharge. Left eye exhibits no discharge. No scleral icterus.  Neck: Normal range of motion. Neck supple.  Cardiovascular: Normal rate and regular rhythm. Exam reveals no gallop and no friction rub.  No murmur heard. Pulmonary/Chest: Effort normal and breath sounds normal. No stridor. No respiratory distress. She has no rales.  Abdominal: Soft. She exhibits no distension. There is no tenderness.  Musculoskeletal: She exhibits no edema or tenderness.  Neurological: She is alert and oriented to person, place, and time.  Mental Status:  Alert and oriented to person, place, and time.  Attention and concentration normal.  Speech clear.  Recent memory is intact  Cranial Nerves:  II Visual Fields: Intact to confrontation. Visual fields intact. III, IV, VI: Pupils equal and reactive to light and near. Full eye movement without nystagmus  V Facial Sensation: Normal. No weakness of masticatory muscles  VII: No facial weakness or asymmetry  VIII Auditory Acuity: Grossly normal  IX/X: The uvula is midline; the palate elevates symmetrically  XI: Normal sternocleidomastoid and trapezius strength  XII: The tongue is midline. No atrophy or fasciculations.   Motor System: Muscle Strength: 5/5 and symmetric in the upper and lower extremities. No pronation or drift.  Muscle Tone: Tone and muscle bulk are normal in the upper and lower extremities.   Reflexes: DTRs: 1+ and symmetrical in all four extremities. No Clonus Coordination: Intact finger-to-nose.No tremor.  Sensation: Intact to light touch, and pinprick. .  Gait: Routine gait normal.   Skin: Skin is warm and dry. No rash noted. She is not  diaphoretic. No erythema.  Psychiatric: She has a normal mood and affect.  Vitals reviewed.   ED Results and Treatments Labs (all labs ordered are listed, but only abnormal results are displayed) Labs Reviewed  CBC - Abnormal; Notable for the following components:      Result Value   MCH 25.6 (*)    All other components within normal limits  BASIC METABOLIC PANEL  I-STAT TROPONIN, ED  I-STAT BETA HCG BLOOD, ED ([facility], [facility], [facility] ONLY)  I-STAT TROPONIN, ED  EKG  EKG Interpretation  Date/Time:  Monday August 04 2018 04:50:59 EDT Ventricular Rate:  74 PR Interval:  112 QRS Duration: 75 QT Interval:  400 QTC Calculation: 444 R Axis:   45 Text Interpretation:  Sinus rhythm rate improved Otherwise no significant change Confirmed by Drema Pry [phone]) on 08/04/2018 4:58:18 AM      Radiology Dg Chest 2 View  Result Date: 08/04/2018 CLINICAL DATA:  Chest pain with some dizziness off and on for a week, worsened symptoms tonight EXAM: CHEST - 2 VIEW COMPARISON:  04/10/2014 FINDINGS: Normal heart size, mediastinal contours, and pulmonary vascularity. Lungs clear. No pleural effusion or pneumothorax. Bones unremarkable. IMPRESSION: Normal exam. Previously identified RIGHT middle lobe pneumonia resolved. Electronically Signed   By: Ulyses Southward M.D.   On: 08/04/2018 01:09   Pertinent labs & imaging results that were available during my care of the patient were reviewed by me and considered in my medical decision making (see chart for details).  Medications Ordered in ED Medications  ibuprofen (ADVIL,MOTRIN) tablet 600 mg (600 mg Oral Given 08/04/18 0322)                                                                                                                                    Procedures Procedures  (including critical care time)  Medical Decision Making / ED  Course I have reviewed the nursing notes for this encounter and the patient's prior records (if available in EHR or on provided paperwork).    1. Chest pain Atypical chest pain highly inconsistent with ACS.  EKG without acute ischemic changes or evidence of pericarditis.  Troponin negative.  Heart score less than 4.  Delta troponin negative.  Chest x-ray without evidence suggestive of pneumonia, pneumothorax, pneumomediastinum.  No abnormal contour of the mediastinum to suggest dissection. No evidence of acute injuries.  Low suspicion for pulmonary embolism.  Presentation not classic for aortic dissection or esophageal perforation.   2. Headache Non focal neuro exam. No recent head trauma. No fever. Doubt meningitis. Doubt intracranial bleed. Doubt IIH. No indication for imaging.   Given motrin PO. Reports improvement.  The patient appears reasonably screened and/or stabilized for discharge and I doubt any other medical condition or other [facility] requiring further screening, evaluation, or treatment in the ED at this time prior to discharge.  The patient is safe for discharge with strict return precautions.     Final Clinical Impression(s) / ED Diagnoses Final diagnoses:  Other chest pain  Episodic tension-type headache, not intractable  Cramps, muscle, general   Disposition: Discharge  Condition: Good  I have discussed the results, Dx and Tx plan with the patient who expressed understanding and agree(s) with the plan. Discharge instructions discussed at great length. The patient was given strict return precautions who verbalized understanding of the instructions. No further questions at time of discharge.    ED Discharge Orders    None  Follow Up: Jac Canavan, PA-C [address] [place] [address] [phone]  Schedule an appointment as soon as possible for a visit  As needed      This chart was dictated using voice recognition software.   Despite best efforts to proofread,  errors can occur which can change the documentation meaning.   Nira Conn, MD 08/04/18 [phone]

## 2018-08-20 ENCOUNTER — Ambulatory Visit (INDEPENDENT_AMBULATORY_CARE_PROVIDER_SITE_OTHER): Payer: Self-pay | Admitting: Medical

## 2018-08-20 VITALS — BP 110/70 | HR 86 | Temp 98.5°F | Resp 16 | Ht 64.0 in | Wt 178.2 lb

## 2018-08-20 DIAGNOSIS — M79603 Pain in arm, unspecified: Secondary | ICD-10-CM

## 2018-08-20 DIAGNOSIS — M26629 Arthralgia of temporomandibular joint, unspecified side: Secondary | ICD-10-CM

## 2018-08-20 DIAGNOSIS — M791 Myalgia, unspecified site: Secondary | ICD-10-CM

## 2018-08-20 DIAGNOSIS — M79606 Pain in leg, unspecified: Secondary | ICD-10-CM

## 2018-08-20 MED ORDER — DULOXETINE HCL 30 MG PO CPEP: 30.0000 mg | ORAL_CAPSULE | Freq: Every day | ORAL | 1 refills | Status: AC

## 2018-08-20 NOTE — Patient Instructions (Signed)
Jaw pain and TMJ syndrome  Begin the ibuprofen prescription you already have 1 tablet 2 or 3 times a day for the next 5 days.  Take this with food  Begin Flexeril for muscle relaxer at bedtime the next 3 to 5 days.  Caution as this can make you sleepy  Avoid heavy chewing the next week, limit talking to some extent  Use cool pack or ice water pack to both jaws 15 minutes twice daily the next 5 days  For generalized pains, begin Cymbalta 30 mg 1 tablet daily This may cause some nausea the first week but this generally subsides  Plan on returning for a physical when your new insurance kicks in in about a month   Your symptoms could suggest fibromyalgia    Myofascial Pain Syndrome and Fibromyalgia Myofascial pain syndrome and fibromyalgia are both pain disorders. This pain may be felt mainly in your muscles.  Myofascial pain syndrome: ? Always has trigger points or tender points in the muscle that will cause pain when pressed. The pain may come and go. ? Usually affects your neck, upper back, and shoulder areas. The pain often radiates into your arms and hands.  Fibromyalgia: ? Has muscle pains and tenderness that come and go. ? Is often associated with fatigue and sleep disturbances. ? Has trigger points. ? Tends to be long-lasting (chronic), but is not life-threatening.  Fibromyalgia and myofascial pain are not the same. However, they often occur together. If you have both conditions, each can make the other worse. Both are common and can cause enough pain and fatigue to make day-to-day activities difficult. What are the causes? The exact causes of fibromyalgia and myofascial pain are not known. People with certain gene types may be more likely to develop fibromyalgia. Some factors can be triggers for both conditions, such as:  Spine disorders.  Arthritis.  Severe injury (trauma) and other physical stressors.  Being under a lot of stress.  A medical illness.  What are  the signs or symptoms? Fibromyalgia The main symptom of fibromyalgia is widespread pain and tenderness in your muscles. This can vary over time. Pain is sometimes described as stabbing, shooting, or burning. You may have tingling or numbness, too. You may also have sleep problems and fatigue. You may wake up feeling tired and groggy (fibro fog). Other symptoms may include:  Bowel and bladder problems.  Headaches.  Visual problems.  Problems with odors and noises.  Depression or mood changes.  Painful menstrual periods (dysmenorrhea).  Dry skin or eyes.  Myofascial pain syndrome Symptoms of myofascial pain syndrome include:  Tight, ropy bands of muscle.  Uncomfortable sensations in muscular areas, such as: ? Aching. ? Cramping. ? Burning. ? Numbness. ? Tingling. ? Muscle weakness.  Trouble moving certain muscles freely (range of motion).  How is this diagnosed? There are no specific tests to diagnose fibromyalgia or myofascial pain syndrome. Both can be hard to diagnose because their symptoms are common in many other conditions. Your health care provider may suspect one or both of these conditions based on your symptoms and medical history. Your health care provider will also do a physical exam. The key to diagnosing fibromyalgia is having pain, fatigue, and other symptoms for more than three months that cannot be explained by another condition. The key to diagnosing myofascial pain syndrome is finding trigger points in muscles that are tender and cause pain elsewhere in your body (referred pain). How is this treated? Treating fibromyalgia and myofascial pain  often requires a team of health care providers. This usually starts with your primary provider and a physical therapist. You may also find it helpful to work with alternative health care providers, such as massage therapists or acupuncturists. Treatment for fibromyalgia may include medicines. This may include nonsteroidal  anti-inflammatory drugs (NSAIDs), along with other medicines. Treatment for myofascial pain may also include:  NSAIDs.  Cooling and stretching of muscles.  Trigger point injections.  Sound wave (ultrasound) treatments to stimulate muscles.  Follow these instructions at home:  Take medicines only as directed by your health care provider.  Exercise as directed by your health care provider or physical therapist.  Try to avoid stressful situations.  Practice relaxation techniques to control your stress. You may want to try: ? Biofeedback. ? Visual imagery. ? Hypnosis. ? Muscle relaxation. ? Yoga. ? Meditation.  Talk to your health care provider about alternative treatments, such as acupuncture or massage treatment.  Maintain a healthy lifestyle. This includes eating a healthy diet and getting enough sleep.  Consider joining a support group.  Do not do activities that stress or strain your muscles. That includes repetitive motions and heavy lifting. Where to find more information:  National Fibromyalgia Association: www.fmaware.org  Arthritis Foundation: www.arthritis.org  American Chronic Pain Association: GumSearch.nlwww.theacpa.org/condition/myofascial-pain Contact a health care provider if:  You have new symptoms.  Your symptoms get worse.  You have side effects from your medicines.  You have trouble sleeping.  Your condition is causing depression or anxiety. This information is not intended to replace advice given to you by your health care provider. Make sure you discuss any questions you have with your health care provider. Document Released: 11/12/2005 Document Revised: 04/19/2016 Document Reviewed: 08/18/2014 Elsevier Interactive Patient Education  [employer]2018 Elsevier Inc.

## 2018-08-20 NOTE — Progress Notes (Signed)
Subjective: Chief Complaint  Patient presents with  . jaw pain    jaw pain X sept 4    Here for multiple concerns.  She was seen in the emergency department recently for jaw pain.  She continues to have this pain.  Was diagnosed with TMJ syndrome.  She continues to have pain.  This started out of the blue back in early part of September.  She had been eating some yogurt with granola where she was eating yogurt by itself prior.  Currently pain in bilat jaw and neck.  Sometimes gets headache, sometimes sharp pains.  Sometimes pains in ears.  She denies other upper respiratory symptoms.  Her sister has jaw clenching but she denies this.  She just saw her dentist yesterday who has referred her to TMJ specialist.  She was given Flexeril and ibuprofen by the hospital when she was emergency department has not taken this.  She notes ongoing problems with back pain, leg pain, arm pain, pains on the inside of her extremities, is intermittent.  Sometimes cannot get out of bed feels like staying in the bed all day due to his pain.  Sometimes is tingling in the bottom of her feet.  No fever no weight loss no night sweats.  Review of Systems Constitutional: -fever, -chills, -sweats, -unexpected weight change, +fatigue ENT: -runny nose, -ear pain, -sore throat Cardiology:  -chest pain, -palpitations, -edema Respiratory: -cough, -shortness of breath, -wheezing Gastroenterology: -abdominal pain, -nausea, -vomiting, -diarrhea, -constipation  Hematology: -bleeding or bruising problems Musculoskeletal: -arthralgias, -myalgias, -joint swelling, -back pain Ophthalmology: -vision changes Urology: -dysuria, -difficulty urinating, -hematuria, -urinary frequency, -urgency Neurology: -headache, -weakness, +tingling, +numbness     Past Medical History:  Diagnosis Date  . Anxiety   . Wears contact lenses    Current Outpatient Medications on File Prior to Visit  Medication Sig Dispense Refill  . ibuprofen  (ADVIL,MOTRIN) 200 MG tablet Take 400 mg by mouth every 6 (six) hours as needed for moderate pain. Reported on 01/12/2016    . metFORMIN (GLUCOPHAGE) 500 MG tablet 1 tablet po daily (Patient not taking: Reported on 01/12/2016) 30 tablet 2   No current facility-administered medications on file prior to visit.       Objective: BP 110/70   Pulse 86   Temp 98.5 F (36.9 C) (Oral)   Resp 16   Ht 5\' 4"  (1.626 m)   Wt 178 lb 3.2 oz (80.8 kg)   LMP 06/24/2018 (Exact Date)   SpO2 98%   BMI 30.59 kg/m   General appearance: alert, no distress, WD/WN, AA female HEENT: normocephalic, sclerae anicteric, PERRLA, EOMi, nares patent, no discharge or erythema, pharynx normal Oral cavity: MMM, no lesions, tender over bilat jab laterally and TMJ region, tender behind both ears Neck: supple, no lymphadenopathy, no thyromegaly, no masses Heart: RRR, normal S1, S2, no murmurs Lungs: CTA bilaterally, no wheezes, rhonchi, or rales Back: non tender Musculoskeletal: nontender, no swelling, no obvious deformity Extremities: no edema, no cyanosis, no clubbing Pulses: 2+ symmetric, upper and lower extremities, normal cap refill Neurological: alert, oriented x 3, CN2-12 intact, strength normal upper extremities and lower extremities, sensation normal throughout, DTRs 2+ throughout, no cerebellar signs, gait normal Psychiatric: normal affect, behavior normal, pleasant     Assessment: Encounter Diagnoses  Name Primary?  . TMJ syndrome Yes  . Myalgia   . Upper extremity pain, diffuse, unspecified laterality   . Lower extremity pain, diffuse, unspecified laterality      Plan: We discussed her symptoms,  possible differential.  Reviewed labs in the chart from recent emergency department visit.  Jaw pain and TMJ syndrome  Begin the ibuprofen prescription you already have 1 tablet 2 or 3 times a day for the next 5 days.  Take this with food  Begin Flexeril for muscle relaxer at bedtime the next 3 to 5  days.  Caution as this can make you sleepy  Avoid heavy chewing the next week, limit talking to some extent  Use cool pack or ice water pack to both jaws 15 minutes twice daily the next 5 days  For generalized pains, begin Cymbalta 30 mg 1 tablet daily This may cause some nausea the first week but this generally subsides  Plan on returning for a physical when your new insurance kicks in in about a month  Your symptoms could suggest fibromyalgia  Bennette was seen today for jaw pain.  Diagnoses and all orders for this visit:  TMJ syndrome  Myalgia  Upper extremity pain, diffuse, unspecified laterality  Lower extremity pain, diffuse, unspecified laterality  Other orders -     DULoxetine (CYMBALTA) 30 MG capsule; Take 1 capsule (30 mg total) by mouth daily.

## 2018-11-19 ENCOUNTER — Encounter (HOSPITAL_COMMUNITY): Payer: Self-pay

## 2018-11-19 ENCOUNTER — Emergency Department (HOSPITAL_COMMUNITY)
Admission: EM | Admit: 2018-11-19 | Discharge: 2018-11-19 | Disposition: A | Discharge status: Home / Self Care | Payer: Self-pay | Attending: Emergency Medicine | Admitting: Emergency Medicine

## 2018-11-19 DIAGNOSIS — R112 Nausea with vomiting, unspecified: Secondary | ICD-10-CM | POA: Insufficient documentation

## 2018-11-19 DIAGNOSIS — M791 Myalgia, unspecified site: Secondary | ICD-10-CM | POA: Insufficient documentation

## 2018-11-19 DIAGNOSIS — Z79899 Other long term (current) drug therapy: Secondary | ICD-10-CM | POA: Insufficient documentation

## 2018-11-19 MED ORDER — ONDANSETRON HCL 4 MG PO TABS: 4.0000 mg | ORAL_TABLET | Freq: Four times a day (QID) | ORAL | 0 refills | Status: DC

## 2018-11-19 MED ORDER — ONDANSETRON HCL 4 MG PO TABS: 4.0000 mg | ORAL_TABLET | Freq: Four times a day (QID) | ORAL | 0 refills | Status: AC

## 2018-11-19 MED ORDER — ONDANSETRON 4 MG PO TBDP
4.0000 mg | ORAL_TABLET | Freq: Once | ORAL | Status: AC
  Administered 2018-11-19: 4 mg via ORAL
  Filled 2018-11-19: qty 1

## 2018-11-19 NOTE — ED Provider Notes (Signed)
[facility] Provider Note   CSN: [id number] Arrival date & time: 11/19/18  1207     History   Chief Complaint Chief Complaint  Patient presents with  . Emesis    HPI Kathleen Casey is a 27 y.o. female.  27 y/o female with a PMH of Anxiety presents to the ED with a chief complaint of emesis x 6 hours. She reports multiple episodes of emesis and describes them as green, yellow "could be red from the cherry soda I had or yellow because of the mac and cheese casserole I had at the party". Reports tasting eggs around her breath. She also reports generalized body aches when vomiting. She does report taking some nausea medication but immediately vomiting afterwards. She denies any chest pain, shortness of breath or abdominal pain. She denies any sexual intercourse   The history is provided by the patient.  Emesis   Pertinent negatives include no abdominal pain and no fever.    Past Medical History:  Diagnosis Date  . Anxiety   . Wears contact lenses     Patient Active Problem List   Diagnosis Date Noted  . Hip pain, bilateral 04/15/2017  . Low back pain without sciatica 04/15/2017  . Prolonged standing 04/15/2017  . Vaginal spotting 04/15/2017  . LEG EDEMA, BILATERAL 12/22/2010    Past Surgical History:  Procedure Laterality Date  . NO PAST SURGERIES  06/2015     OB History   No obstetric history on file.      Home Medications    Prior to Admission medications   Medication Sig Start Date End Date Taking? Authorizing Provider  DULoxetine (CYMBALTA) 30 MG capsule Take 1 capsule (30 mg total) by mouth daily. 08/20/18   Tysinger, Camelia Eng, PA-C  ibuprofen (ADVIL,MOTRIN) 200 MG tablet Take 400 mg by mouth every 6 (six) hours as needed for moderate pain. Reported on 01/12/2016    [provider]  metFORMIN (GLUCOPHAGE) 500 MG tablet 1 tablet po daily Patient not taking: Reported on 01/12/2016 07/08/15   Tysinger, Camelia Eng, PA-C     Family History Family History  Problem Relation Age of Onset  . Hypertension Mother   . Cancer Maternal Grandmother   . Heart disease Maternal Grandfather 46       died of MI  . Diabetes Neg Hx   . Stroke Neg Hx     Social History Social History   Tobacco Use  . Smoking status: Never Smoker  . Smokeless tobacco: Never Used  Substance Use Topics  . Alcohol use: Yes    Comment: rarely  . Drug use: No     Allergies   Patient has no known allergies.   Review of Systems Review of Systems  Constitutional: Negative for fever.  Gastrointestinal: Positive for nausea and vomiting. Negative for abdominal pain.     Physical Exam Updated Vital Signs BP 124/80   Pulse 100   Temp 98.8 F (37.1 C) (Oral)   Resp 15   SpO2 100%   Physical Exam Vitals signs and nursing note reviewed.  Constitutional:      General: She is not in acute distress.    Appearance: She is well-developed. She is not ill-appearing.     Comments: Well appearing texting on cellphone comfortably in chair.   HENT:     Head: Normocephalic and atraumatic.     Mouth/Throat:     Lips: Pink.     Mouth: Mucous membranes are moist.  Pharynx: Oropharynx is clear. Uvula midline. No oropharyngeal exudate or posterior oropharyngeal erythema.  Eyes:     Pupils: Pupils are equal, round, and reactive to light.  Neck:     Musculoskeletal: Normal range of motion.  Cardiovascular:     Rate and Rhythm: Regular rhythm.     Heart sounds: Normal heart sounds.  Pulmonary:     Effort: Pulmonary effort is normal. No respiratory distress.     Breath sounds: Normal breath sounds.  Abdominal:     General: Bowel sounds are normal. There is no distension.     Palpations: Abdomen is soft.     Tenderness: There is no abdominal tenderness.  Musculoskeletal:        General: No tenderness or deformity.     Right lower leg: No edema.     Left lower leg: No edema.  Skin:    General: Skin is warm and dry.   Neurological:     Mental Status: She is alert and oriented to person, place, and time.      ED Treatments / Results  Labs (all labs ordered are listed, but only abnormal results are displayed) Labs Reviewed - No data to display  EKG None  Radiology No results found.  Procedures Procedures (including critical care time)  Medications Ordered in ED Medications  ondansetron (ZOFRAN-ODT) disintegrating tablet 4 mg (4 mg Oral Given 11/19/18 1246)     Initial Impression / Assessment and Plan / ED Course  I have reviewed the triage vital signs and the nursing notes.  Pertinent labs & imaging results that were available during my care of the patient were reviewed by me and considered in my medical decision making (see chart for details).    Patient presents with emesis since this morning at 5am. Attempted medication but reports she immediately vomited afterwards. Patient is well appearing with no distress, has had no vomiting episodes in the ED for two hours, is on the phone with friends talking. She was provided with Zofran along with PO challenged which she tolerated appropriately, she is eating gram crackers no distress. Afebrile. Patient is well appearing states she attended a [employer] party were she ate all these food that people had cooked. Strict return precautions provided.     Final Clinical Impressions(s) / ED Diagnoses   Final diagnoses:  Nausea and vomiting, intractability of vomiting not specified, unspecified vomiting type    ED Discharge Orders    None       Janeece Fitting, PA-C 11/19/18 1430    Quintella Reichert, MD 11/19/18 1730

## 2018-11-19 NOTE — ED Triage Notes (Signed)
Vomiting since this morning, chills, body aches

## 2018-11-19 NOTE — ED Notes (Signed)
Pt reports being able to keep down graham crackers and ginger ale and feeling a little better

## 2018-11-19 NOTE — ED Notes (Signed)
Pt stable, ambulatory, states understanding of discharge instructions 

## 2018-11-19 NOTE — Discharge Instructions (Addendum)
I have provided medication to help with your nausea, please take as directed. Continue to hydrate with plenty of fluids and Gatorade. Follow up wit your primary care physician as needed.

## 2019-03-19 IMAGING — DX DG CHEST 2V
2 series · 2 of 2 positions shown · non-contrast
Comparison: 04/10/2014

CLINICAL DATA: Chest pain with some dizziness off and on for a
week, worsened symptoms tonight

EXAM:
CHEST - 2 VIEW

[w chest pa]
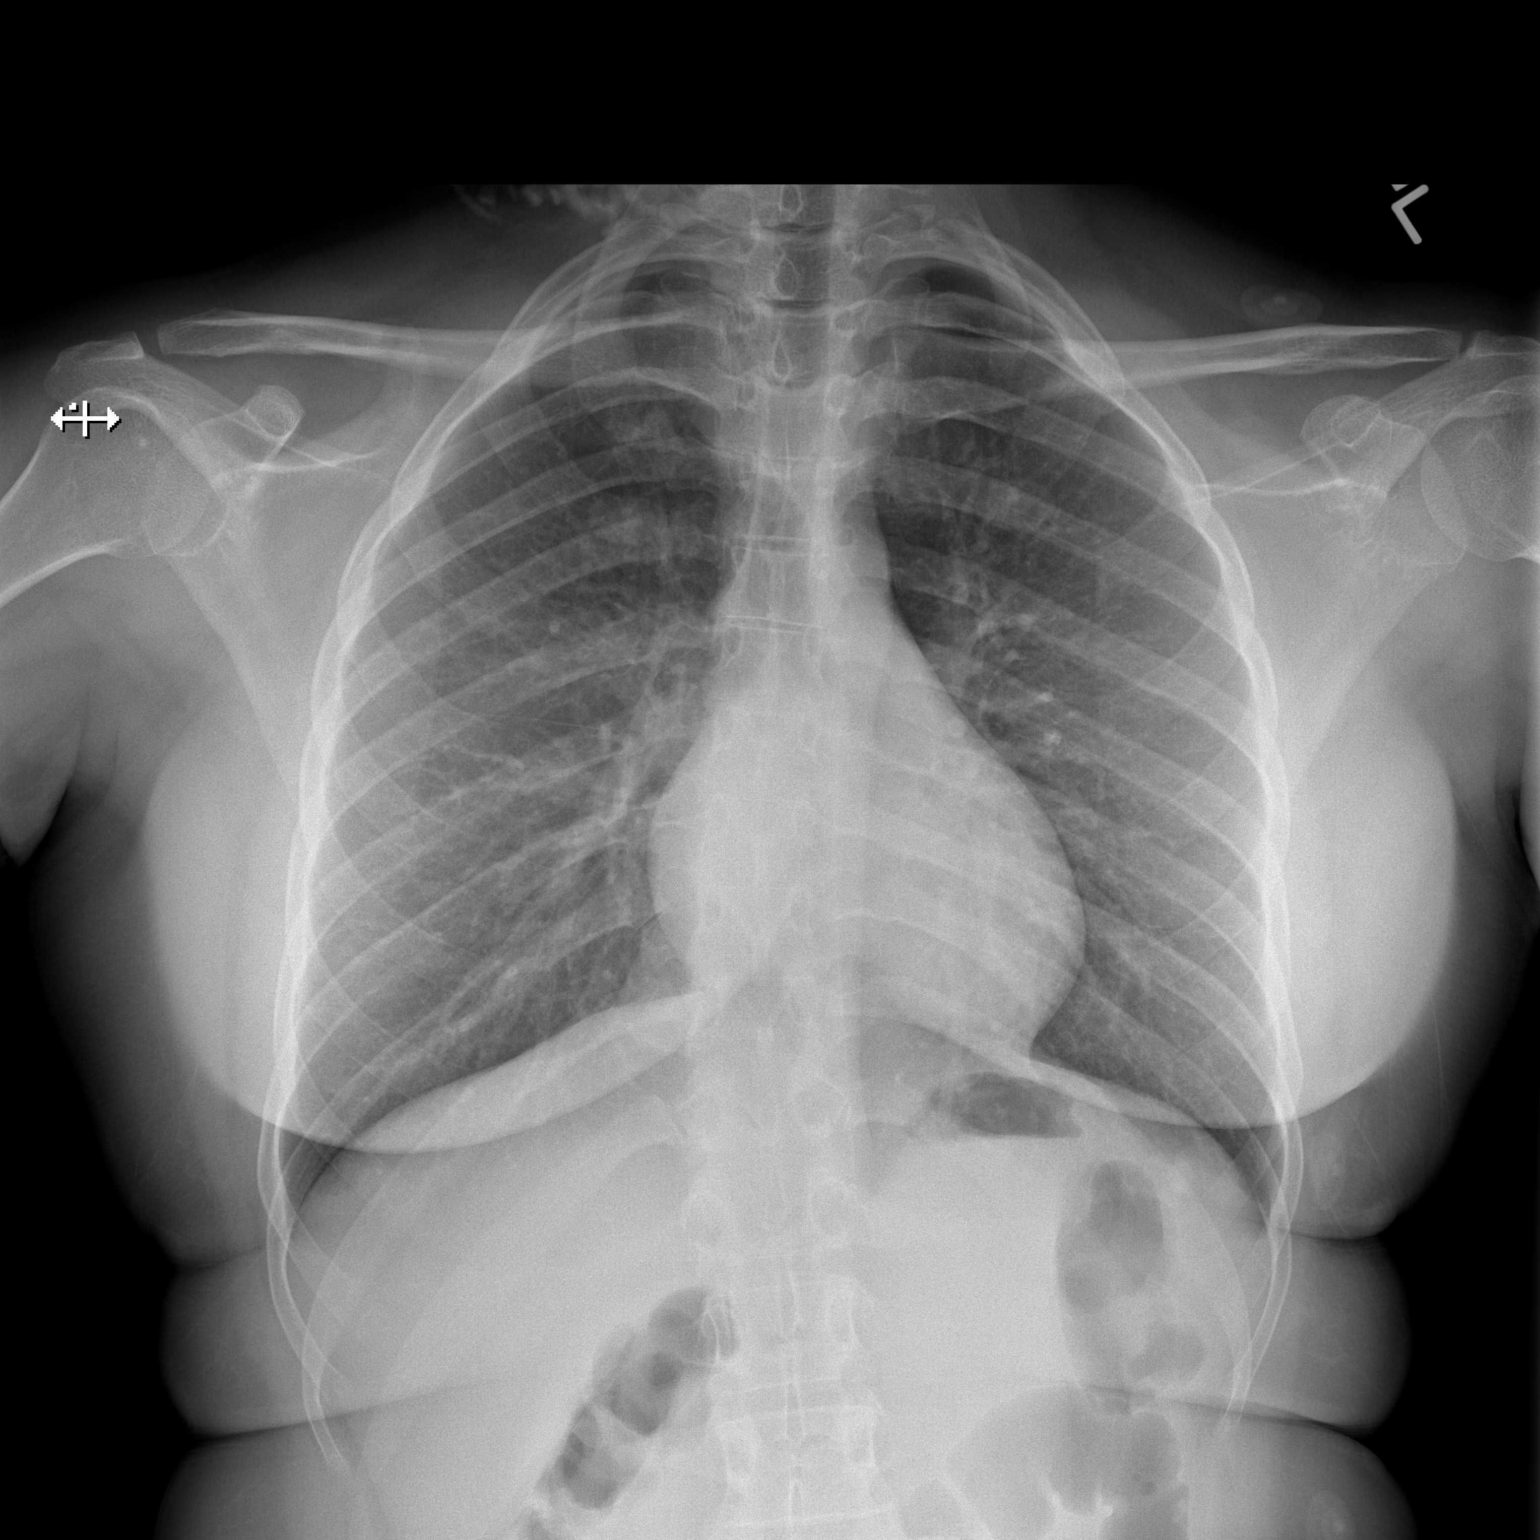

[w chest lat]
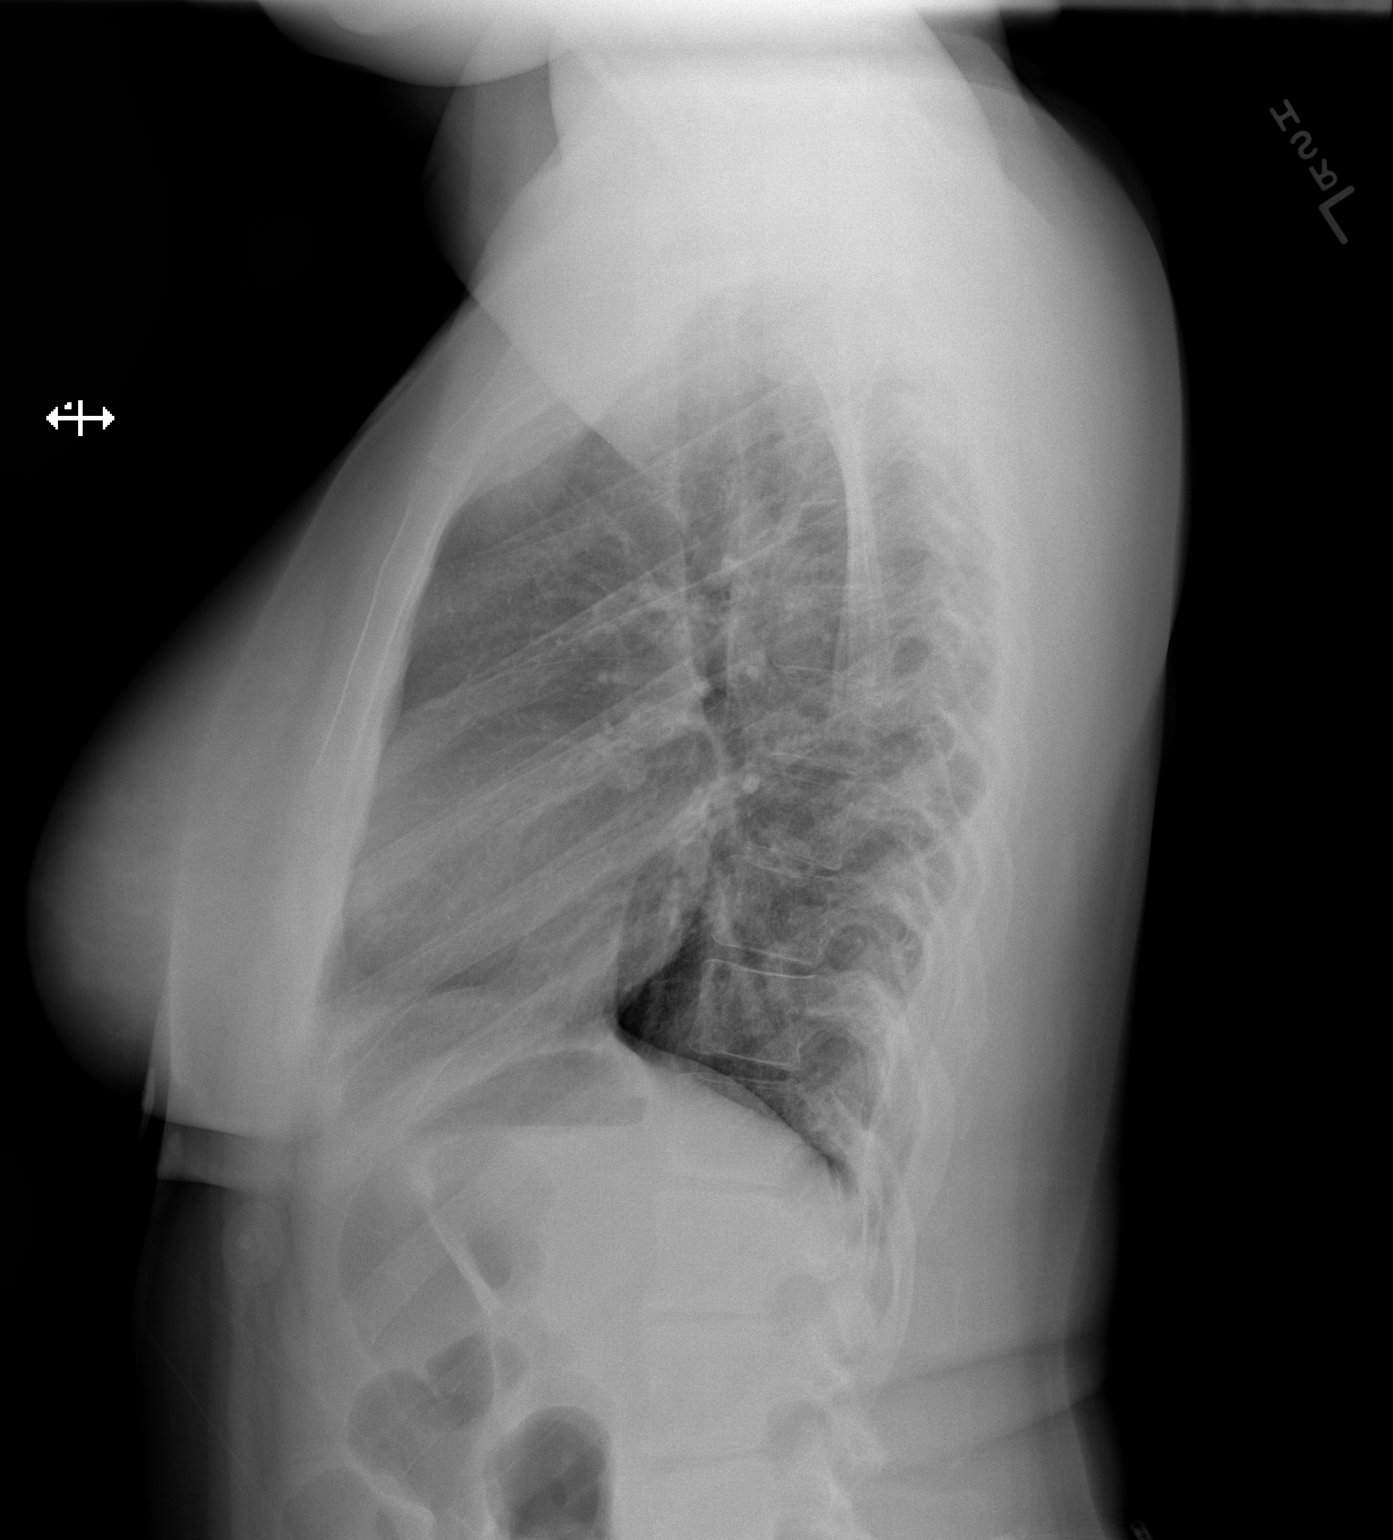

[2 of 2 positions shown; findings below may reference images not displayed]

FINDINGS: Normal heart size, mediastinal contours, and pulmonary vascularity.

Lungs clear.

No pleural effusion or pneumothorax.

Bones unremarkable.
IMPRESSION: Normal exam.

Previously identified RIGHT middle lobe pneumonia resolved.
